# Patient Record
Sex: Male | Born: 1960 | Race: White | Hispanic: No | Marital: Married | Smoking: Former smoker
Health system: Southern US, Community
[De-identification: ages and names within clinical notes are randomized; demographics above are authoritative.]

## PROBLEM LIST (undated history)

## (undated) DIAGNOSIS — I10 Essential (primary) hypertension: Secondary | ICD-10-CM

## (undated) HISTORY — PX: HERNIA REPAIR: SHX51

## (undated) HISTORY — PX: BACK SURGERY: SHX140

---

## 2006-10-13 ENCOUNTER — Emergency Department: Payer: Self-pay | Admitting: Emergency Medicine

## 2006-10-28 ENCOUNTER — Emergency Department: Payer: Self-pay

## 2006-11-11 ENCOUNTER — Other Ambulatory Visit: Payer: Self-pay

## 2006-11-11 ENCOUNTER — Ambulatory Visit: Payer: Self-pay | Admitting: General Surgery

## 2007-03-29 ENCOUNTER — Ambulatory Visit: Payer: Self-pay | Admitting: Gastroenterology

## 2008-11-30 ENCOUNTER — Ambulatory Visit (HOSPITAL_COMMUNITY): Admission: RE | Admit: 2008-11-30 | Discharge: 2008-12-01 | Payer: Self-pay | Admitting: Specialist

## 2009-06-20 ENCOUNTER — Encounter (INDEPENDENT_AMBULATORY_CARE_PROVIDER_SITE_OTHER): Payer: Self-pay | Admitting: Specialist

## 2009-06-20 ENCOUNTER — Ambulatory Visit (HOSPITAL_COMMUNITY): Admission: RE | Admit: 2009-06-20 | Discharge: 2009-06-21 | Payer: Self-pay | Admitting: Specialist

## 2011-02-15 LAB — COMPREHENSIVE METABOLIC PANEL
Albumin: 3.9 g/dL (ref 3.5–5.2)
Alkaline Phosphatase: 48 U/L (ref 39–117)
BUN: 10 mg/dL (ref 6–23)
Chloride: 102 mEq/L (ref 96–112)
Creatinine, Ser: 1.03 mg/dL (ref 0.4–1.5)
Glucose, Bld: 86 mg/dL (ref 70–99)
Potassium: 3.9 mEq/L (ref 3.5–5.1)
Total Bilirubin: 1 mg/dL (ref 0.3–1.2)
Total Protein: 7.6 g/dL (ref 6.0–8.3)

## 2011-02-15 LAB — CBC
HCT: 46.7 % (ref 39.0–52.0)
Hemoglobin: 15.5 g/dL (ref 13.0–17.0)
MCV: 85.5 fL (ref 78.0–100.0)
Platelets: 210 10*3/uL (ref 150–400)
RDW: 14.4 % (ref 11.5–15.5)

## 2011-02-15 LAB — URINALYSIS, ROUTINE W REFLEX MICROSCOPIC
Bilirubin Urine: NEGATIVE
Hgb urine dipstick: NEGATIVE
Nitrite: NEGATIVE
Protein, ur: NEGATIVE mg/dL
Specific Gravity, Urine: 1.015 (ref 1.005–1.030)
Urobilinogen, UA: 0.2 mg/dL (ref 0.0–1.0)

## 2011-02-15 LAB — PROTIME-INR
INR: 1 (ref 0.00–1.49)
Prothrombin Time: 13 seconds (ref 11.6–15.2)

## 2011-02-15 LAB — APTT: aPTT: 29 seconds (ref 24–37)

## 2011-02-24 LAB — CBC
MCHC: 32.7 g/dL (ref 30.0–36.0)
MCV: 84.5 fL (ref 78.0–100.0)
Platelets: 209 10*3/uL (ref 150–400)
RDW: 14.7 % (ref 11.5–15.5)

## 2011-02-24 LAB — COMPREHENSIVE METABOLIC PANEL
ALT: 24 U/L (ref 0–53)
AST: 18 U/L (ref 0–37)
Albumin: 4.1 g/dL (ref 3.5–5.2)
Calcium: 9.4 mg/dL (ref 8.4–10.5)
Creatinine, Ser: 1.01 mg/dL (ref 0.4–1.5)
GFR calc Af Amer: >60 mL/min (ref >60)
GFR calc non Af Amer: >60 mL/min (ref >60)
Sodium: 143 mEq/L (ref 135–145)
Total Protein: 7.4 g/dL (ref 6.0–8.3)

## 2011-02-24 LAB — URINALYSIS, ROUTINE W REFLEX MICROSCOPIC
Ketones, ur: NEGATIVE mg/dL
Nitrite: NEGATIVE
Specific Gravity, Urine: 1.023 (ref 1.005–1.030)
pH: 6 (ref 5.0–8.0)

## 2011-02-24 LAB — APTT: aPTT: 37 seconds (ref 24–37)

## 2011-03-25 NOTE — Op Note (Signed)
NAMEJAYLUN, FLEENER               ACCOUNT NO.:  [id number]   MEDICAL RECORD NO.:  [id number]          PATIENT TYPE:  OIB   LOCATION:  0098                         FACILITY:  [facility]   PHYSICIAN:  Jene Every, M.D.    DATE OF BIRTH:  Dec 20, 1960   DATE OF PROCEDURE:  11/30/2008  DATE OF DISCHARGE:                               OPERATIVE REPORT   PREOPERATIVE DIAGNOSIS:  Recurrent herniated nucleus pulposus spinal  stenosis, L5-S1.   POSTOPERATIVE DIAGNOSIS:  Recurrent herniated nucleus pulposus spinal  stenosis, L5-S1.   PROCEDURES PERFORMED:  1. Bilateral lateral recess decompression redo at L5-S1.  2. Bilateral foraminotomies at S1.  3. Microdiskectomy, L5-S1, left.   ANESTHESIA:  General.   ASSISTANT:  Roma Schanz, P.A.   Technical difficulty increased due to the patient's morbid obesity and  previous surgery.   TECHNIQUE:  The patient in supine position.  After induction of adequate  anesthesia and 2 grams Kefzol, he was placed prone on the [place] frame.  All bony prominences well-padded.  Lumbar region prepped and draped in  the usual sterile fashion.  Two 18-gauge spinal needles utilized to  localize the 5-1 interspace.  This was confirmed with x-ray.  Previous  surgical incision was excised.  Subcutaneous tissue was dissected and  electrocautery was utilized for hemostasis.  Dorsolumbar fascia  identified and divided in line with the skin incision.  Paraspinous  muscle elevated from lamina of 5-1.  The McCullough retractor was placed  first on the left at the nonoperative space, although there was scar  tissue here, suggesting this was entered as well.  Confirmatory  radiograph obtained.  The curet was utilized to detach ligamentum flavum  from the cephalad edge of S1.  Hemilaminotomy with 2 mm Kerrison was  performed, detaching the ligamentum flavum.  Neural patty placed beneath  the ligamentum flavum.  A 2 mm was then utilized to decompress the  lateral  recess to medial border of the pedicle.  Hemilaminotomy of the  caudad edge of 5 was performed as well.  The patient did have some  lateral recess stenosis here, but no disk herniation.  The nerve was  mobile at least a centimeter medial to the pedicle, without difficulty  and a hockey stick probe was placed at the foramen of 5 and found to be  widely patent.  We entered the right side, as the patient preoperatively  was found and had indicated over the past 2 weeks he had had right-sided  symptoms, worse than his left and had positive neural tension signs  bilaterally.   Next, then we turned attention to the left.  The operating microscope  was draped and brought on the surgical field.  A fair amount of scar  tissue was encountered and debrided.  We skeletonized the previous  hemilaminotomy with a small curette.  We enlarged the hemilaminotomy at  S1 and under 5, detaching the remaining ligamentum flavum.  Severe  lateral recess stenosis noted and the S1 nerve was noted be compressed  into the lateral recess.  We decompressed the lateral recess to a medial  border  of the pedicle.  There was epidural fibrosis and a vascular  release tethering the S1 nerve root.  This was gently lysed.  We able to  mobilize the S1 nerve root at least a centimeter medial to the pedicle  without difficulty.  Beneath that was a focal HNP, that we confirmed the  space by x-ray, and it was a fairly hardened disk towards the center.  I  performed the annulotomy and small portions of disk material were  removed.  This was extensively evaluated, but again the predominance,  this was a hardened, calcified disk.  Again, centrally, following  decompression laterally and the lysis of adhesions, we had good mobility  of the S1 nerve root.  Performed a foraminotomy of 5 and hockey stick  probe passed freely up the foramen of 5 and S1.  Disk space and the  lateral recess were copiously irrigated with antibiotic  irrigation.  Inspection revealed no evidence of CSF leakage or active bleeding.  Epidural fat was draped over the nerve root.  McCullough retractor was  removed was removed.  We irrigated the paraspinous muscles.  No evidence  of active bleeding.  We repaired the dorsolumbar fascia with #1 Vicryl  interrupted figure-of-eight sutures.  Subcutaneous tissue reapproximated  with 2-0 Vicryl simple sutures.  Skin was reapproximated with staples.  The wound was dressed sterilely.  He was then placed on the hospital  bed, extubated without difficulty and transported to the recovery room  in satisfactory condition.   The patient tolerated the procedure well, no complications.  Assistant,  Roma Schanz, P.A.  Minimal blood loss.      Jene Every, M.D.  Electronically Signed     JB/MEDQ  D:  11/30/2008  T:  11/30/2008  Job:  [id number]

## 2011-03-25 NOTE — Op Note (Signed)
Bryan [name], Bryan [name]               ACCOUNT NO.:  [id number]   MEDICAL RECORD NO.:  [id number]          PATIENT TYPE:  AMB   LOCATION:  DAY                          FACILITY:  [facility]   PHYSICIAN:  Jene Every, M.D.    DATE OF BIRTH:  1961/09/21   DATE OF PROCEDURE:  DATE OF DISCHARGE:                               OPERATIVE REPORT   PREOPERATIVE DIAGNOSIS:  Recurrent disk herniation, spinal stenosis L3,  right.   POSTOPERATIVE DIAGNOSES:  Recurrent disk herniation, spinal stenosis L3,  right.   PROCEDURES PERFORMED:  1. Redo microdiskectomy L5-S1, L3-4 right.  2. Redo decompression L3-4, right.  3. Use of operating microscope.   BRIEF HISTORY:  47, L4 radiculopathy secondary recurrent disk herniation  L3-4.  Failed conserve treatment, indicated for decompression.  Risks,  benefits discussed, including bleeding, infection, __damage to  neurovascular structures________, CSF leakage, epidural fibrosis,  __adjacent________ disease, need for fusion in future, anesthetic  complications etc.   The patient in supine position.  After induction of adequate anesthesia,  2 g Kefzol, was placed prone on the [place] frame.  All bony prominences  well padded.  Lumbar region prepped and draped in the usual sterile  fashion.  Two 20-gauge spinal needles utilized to localize the 3-4  interspace.  I excised previous surgical scar.  Incision was made to the  spinous process 3 to 4, subcutaneous tissue was dissected.  Electrocautery used to achieve hemostasis.  Dorsolumbar fascia  identified, divided in line with the skin incision.  Paraspinous muscle  elevated from lamina of 3 and 4.  Operating microscope draped and  brought into the surgical field.  Penfield 4 in the interlaminar space.  Detected the 2-3 space first.  We redirected the McCullough retractor to  the 3-4 space seeing scar tissue, confirming the level with a repeat  radiograph.  I used a straight curette to detach the fibrosis from  the  caudad edge of 3.  We had an ample pars here.  We entered the medial  aspect of the inferior facet with a straight curette and 2-mm Kerrison  performing an enlarged hemilaminotomy, preserving the pars up to the  attachment of the ligamentum flavum that was removed, epidural fat was  noted here.  Distally we performed a foraminotomy of L4, identified the  L4 root, gently mobilized it medially, meticulously mobilized the 4 root  from extensive epidural fibrosis.  We had a retractor gently mobilizing  it medially.  We identified the disk space.  With a penfield 4__________  mobilized it and identified the lateral aspect of thecal sac.  We then  identified the disk herniation and mobilized three large fragments from  beneath thecal sac without difficulty then entered the disk space with a  micropituitary completing a full diskectomy of herniated material.  Following this and a foraminotomy of L3-L4 there was ample mobilization  of 4 root and of the 3 root.  No CSF leakage or active bleeding.  We  copiously irrigated disk space with antibiotic irrigation.  Bipolar  cautery to achieve hemostasis.  Hockey stick probe passed freely in the  foramen of 3 and of 4.  There was an ample and wide pars.  No fracture.   Next, McCullough retractors removed, irrigated.  No paraspinous bleeding  noted.  Dorsolumbar fascia reapproximated #1 Vicryl figure-of-eight  sutures.  Subcutaneous tissue reapproximated with 2-0 Vicryl closure.  Skin was reapproximated 3-0 subcuticular Prolene.  Wound reinforced with  Steri-Strips.  Sterile dressing applied.  Placed supine on hospital bed,  extubated without difficulty and transported to recovery in satisfactory  condition.   The patient tolerated the procedure.  There were no complications.Bryan [name] BLOOD LOSS:  50 mL      Jene Every, M.D.  Electronically Signed     JB/MEDQ  D:  06/20/2009  T:  06/20/2009  Job:  [id number]

## 2011-03-28 NOTE — Op Note (Signed)
[name], [name] NO.:  [id number]   MEDICAL RECORD NO.:  [id number]           PATIENT TYPE:   LOCATION:                                 FACILITY:   PHYSICIAN:  Jene Every, M.D.         DATE OF BIRTH:   DATE OF PROCEDURE:  DATE OF DISCHARGE:                               OPERATIVE REPORT   ADDENDUM   ASSISTANT:  Roma Schanz, PA      Jene Every, M.D.  Electronically Signed     JB/MEDQ  D:  06/22/2009  T:  06/23/2009  Job:  [id number]

## 2013-07-08 ENCOUNTER — Ambulatory Visit: Payer: Self-pay | Admitting: Gastroenterology

## 2013-08-15 ENCOUNTER — Ambulatory Visit: Payer: Self-pay | Admitting: Specialist

## 2013-08-15 LAB — POTASSIUM: Potassium: 3.7 mmol/L (ref 3.5–5.1)

## 2013-08-19 ENCOUNTER — Ambulatory Visit: Payer: Self-pay | Admitting: Specialist

## 2015-03-02 NOTE — Op Note (Signed)
PATIENT NAME:  Bryan [name], Bryan [name] MR#:  [id number] DATE OF BIRTH:  16-Jan-1961  DATE OF PROCEDURE:  08/19/2013  PREOPERATIVE DIAGNOSIS: Severe posterior tibial tendinitis, left ankle with prominent medial bone fragment.   POSTOPERATIVE DIAGNOSES: 1.  Rupture posterior tibial tendon, left ankle.  2.  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  3.  Prominent medial malleolus bone fragment.   PROCEDURE: 1.  Complete synovectomy posterior tibial tendon, left ankle.  2.  Repair of ruptured posterior tibial tendon with transfer of flexor digitorum tendon to augment and complete the repair.   SURGEON: Myra Rudehristopher Lillyian Heidt, M.D.   ANESTHESIA: General.   COMPLICATIONS: None.   Tourniquet time: 75 minutes.   DESCRIPTION OF PROCEDURE: Ancef 2 grams were given intravenously prior to the procedure. General anesthesia is induced. The left lower extremity is thoroughly prepped with alcohol and ChloraPrep and draped in standard sterile fashion. The leg is elevated and the extremity is wrapped out with the Esmarch bandage. Pneumatic tourniquet is elevated to 350 mmHg. Smile-shaped posterior incision is made over the line of the posterior tibial tendon. The dissection is carefully carried down to the tendon sheath. Hemostasis is maintained at all times with the Bovie. The posterior tibial tendon sheath is carefully incised and there is seen to be [name] significant amount of hypertrophic synovium present and [name] moderate amount of brown-yellow material, which is possibly consistent with pigmented villo-nodular synovitis. Complete synovectomy is performed along with removal of the other material. There is seen to be essentially [name] complete rupture of the posterior tibial tendon directly behind the medial malleolus and this is associated with [name] prominent ridge of bone, which is the fragment of bone seen on the x-ray. The rongeur is used to remove the fragment and the area of the medial malleolus  is smoothed off and the open cancellous surfaces is covered with bone wax. The frayed and useless portions of the posterior tibial tendon are removed sharply with the knife and the scissors. The tendon is repaired incompletely with multiple 4-0 Mersilene sutures. The tendon sheath posterior to this was then opened and the flexor digitorum of the toes is brought up adjacent to the posterior tibial tendon and sutured in continuity with using multiple horizontal mattress sutures of all 4-0 Mersilene. The wound is thoroughly irrigated multiple times. The posterior tibial tendon sheath is then loosely closed with 4-0 nylon. Subcutaneous tissue is closed with 5-0 Vicryl and the skin is closed with interrupted 4-0 nylon sutures. The wound is thoroughly irrigated multiple times again. Soft bulky dressing is applied along with [name] sugar tong type posterior splint. The tourniquet is released. The patient is returned to the recovery room in satisfactory condition having tolerated the procedure quite well.    ____________________________ Clare Gandyhristopher E. Kairy Folsom, MD ces:aw D: 08/22/2013 08:24:08 ET T: 08/22/2013 09:10:16 ET JOB#: [id number]  cc: Clare Gandyhristopher E. Kendi Defalco, MD, <Dictator> Clare GandyHRISTOPHER E Jemuel Laursen MD ELECTRONICALLY SIGNED 08/24/2013 6:31

## 2015-03-25 ENCOUNTER — Ambulatory Visit
Admission: EM | Admit: 2015-03-25 | Discharge: 2015-03-25 | Disposition: A | Discharge status: Home / Self Care | Payer: PRIVATE HEALTH INSURANCE | Attending: Family Medicine | Admitting: Family Medicine

## 2015-03-25 ENCOUNTER — Encounter: Payer: Self-pay | Admitting: *Deleted

## 2015-03-25 DIAGNOSIS — J0100 Acute maxillary sinusitis, unspecified: Secondary | ICD-10-CM | POA: Diagnosis not present

## 2015-03-25 HISTORY — DX: Essential (primary) hypertension: I10

## 2015-03-25 MED ORDER — IPRATROPIUM BROMIDE 0.06 % NA SOLN: 2.0000 | NASAL | Status: DC | PRN

## 2015-03-25 MED ORDER — PREDNISONE 10 MG PO TABS: ORAL_TABLET | ORAL | Status: DC

## 2015-03-25 MED ORDER — AMOXICILLIN-POT CLAVULANATE 875-125 MG PO TABS: 1.0000 | ORAL_TABLET | Freq: Two times a day (BID) | ORAL | Status: DC

## 2015-03-25 MED ORDER — HYDROCOD POLST-CPM POLST ER 10-8 MG/5ML PO SUER: 5.0000 mL | Freq: Two times a day (BID) | ORAL | Status: DC | PRN

## 2015-03-25 NOTE — ED Notes (Signed)
Sinus pressure, headache, cough and general malaise since Wednesday.  Fever and chills.  Lungs clear.

## 2015-03-25 NOTE — ED Provider Notes (Signed)
CSN: [id number]     Arrival date & time 03/25/15  1133 History   First MD Initiated Contact with Patient 03/25/15 1323     Chief Complaint  Patient presents with  . Facial Pain   (Consider location/radiation/quality/duration/timing/severity/associated sxs/prior Treatment) HPI     54 year old male presents for evaluation of what he assumes a sinus infection. Starting 5 days ago he has sinus pain and pressure, sore throat, cough, nasal burning pain, subjective fever and chills, body aches. Symptoms have gradually been worsening up until today. Over-the-counter medications are not helping. No recent travel or sick contacts.  Past Medical History  Diagnosis Date  . Hypertension    Past Surgical History  Procedure Laterality Date  . Hernia repair    . Back surgery    . Back surgery  ankle acl right   Family History  Problem Relation Age of Onset  . Heart failure Father   . Cancer Sister    History  Substance Use Topics  . Smoking status: Former Smoker -- 0.50 packs/day    Types: Cigarettes    Quit date: 03/25/1979  . Smokeless tobacco: Not on file  . Alcohol Use: Yes    Review of Systems  Constitutional: Positive for fever and chills.  HENT: Positive for congestion, postnasal drip, rhinorrhea, sinus pressure and sore throat. Negative for ear pain.   Respiratory: Positive for cough. Negative for shortness of breath.   Cardiovascular: Negative for chest pain.  Neurological: Positive for headaches.  All other systems reviewed and are negative.   Allergies  Review of patient's allergies indicates no known allergies.  Home Medications   Prior to Admission medications   Medication Sig Start Date End Date Taking? Authorizing Provider  furosemide (LASIX) 20 MG tablet Take 20 mg by mouth.   Yes Historical Provider, MD  HYDROcodone-acetaminophen (NORCO/VICODIN) 5-325 MG per tablet Take 1 tablet by mouth every 6 (six) hours as needed for moderate pain.   Yes Historical Provider, MD   temazepam (RESTORIL) 30 MG capsule Take 30 mg by mouth at bedtime as needed for sleep.   Yes Historical Provider, MD  amoxicillin-clavulanate (AUGMENTIN) 875-125 MG per tablet Take 1 tablet by mouth every 12 (twelve) hours. 03/25/15   Graylon GoodZachary H Yashua Bracco, PA-C  chlorpheniramine-HYDROcodone (TUSSIONEX PENNKINETIC ER) 10-8 MG/5ML SUER Take 5 mLs by mouth every 12 (twelve) hours as needed for cough. 03/25/15   Adrian BlackwaterZachary H Reve Crocket, PA-C  ipratropium (ATROVENT) 0.06 % nasal spray Place 2 sprays into both nostrils every 4 (four) hours as needed for rhinitis. 03/25/15   Graylon GoodZachary H Aleisa Howk, PA-C  predniSONE (DELTASONE) 10 MG tablet 4 tabs PO QD for 4 days; 3 tabs PO QD for 3 days; 2 tabs PO QD for 2 days; 1 tab PO QD for 1 day 03/25/15   Graylon GoodZachary H Jumaane Weatherford, PA-C   BP 124/94 mmHg  Pulse 69  Temp(Src) 98.6 F (37 C) (Oral)  Resp 20  Ht 6' (1.829 m)  Wt 265 lb (120.203 kg)  BMI 35.93 kg/m2  SpO2 96% Physical Exam  Constitutional: He is oriented to person, place, and time. He appears well-developed and well-nourished. He appears distressed (uncomfortable appearing ).  HENT:  Head: Normocephalic and atraumatic.  Right Ear: External ear normal.  Left Ear: External ear normal.  Nose: Right sinus exhibits maxillary sinus tenderness. Right sinus exhibits no frontal sinus tenderness. Left sinus exhibits maxillary sinus tenderness. Left sinus exhibits no frontal sinus tenderness.  Mouth/Throat: Uvula is midline, oropharynx is clear and moist and  mucous membranes are normal. No oropharyngeal exudate or posterior oropharyngeal erythema.  Eyes: Conjunctivae are normal.  Neck: Normal range of motion. Neck supple.  Cardiovascular: Normal rate, regular rhythm and normal heart sounds.   Pulmonary/Chest: Effort normal and breath sounds normal. No respiratory distress. He exhibits no tenderness.  Lymphadenopathy:    He has no cervical adenopathy.  Neurological: He is alert and oriented to person, place, and time. Coordination  normal.  Skin: Skin is warm and dry. No rash noted. He is not diaphoretic.  Psychiatric: He has a normal mood and affect. Judgment normal.  Nursing note and vitals reviewed.   ED Course  Procedures (including critical care time) Labs Review Labs Reviewed - No data to display  Imaging Review No results found.   MDM   1. Acute maxillary sinusitis, recurrence not specified    Acute sinusitis, treat with Augmentin. Also treating symptomatically. Follow-up if no improvement in 3-4 days   Discharge Medication List as of 03/25/2015  1:31 PM    START taking these medications   Details  amoxicillin-clavulanate (AUGMENTIN) 875-125 MG per tablet Take 1 tablet by mouth every 12 (twelve) hours., Starting 03/25/2015, Until Discontinued, Normal    chlorpheniramine-HYDROcodone (TUSSIONEX PENNKINETIC ER) 10-8 MG/5ML SUER Take 5 mLs by mouth every 12 (twelve) hours as needed for cough., Starting 03/25/2015, Until Discontinued, Print    ipratropium (ATROVENT) 0.06 % nasal spray Place 2 sprays into both nostrils every 4 (four) hours as needed for rhinitis., Starting 03/25/2015, Until Discontinued, Normal    predniSONE (DELTASONE) 10 MG tablet 4 tabs PO QD for 4 days; 3 tabs PO QD for 3 days; 2 tabs PO QD for 2 days; 1 tab PO QD for 1 day, Normal           Graylon GoodZachary H Johneisha Broaden, PA-C 03/25/15 1348

## 2015-03-25 NOTE — Discharge Instructions (Signed)

## 2019-12-16 ENCOUNTER — Other Ambulatory Visit: Payer: Self-pay | Admitting: Internal Medicine

## 2019-12-16 DIAGNOSIS — G9349 Other encephalopathy: Secondary | ICD-10-CM

## 2019-12-28 ENCOUNTER — Other Ambulatory Visit: Payer: Self-pay

## 2019-12-28 ENCOUNTER — Ambulatory Visit
Admission: RE | Admit: 2019-12-28 | Discharge: 2019-12-28 | Disposition: A | Discharge status: Home / Self Care | Payer: PRIVATE HEALTH INSURANCE | Source: Ambulatory Visit | Attending: Internal Medicine | Admitting: Internal Medicine

## 2019-12-28 DIAGNOSIS — G9349 Other encephalopathy: Secondary | ICD-10-CM | POA: Diagnosis not present

## 2021-10-18 IMAGING — MR MR HEAD W/O CM
12 series · 45 of 48 positions shown · non-contrast
Comparison: No pertinent prior studies available for comparison.

CLINICAL DATA: Encephalopathy chronic. Additional history provided
by scanning technologist: Patient reports loss of words when
speaking, losing track of tasks, daily, symptoms for 6 months.

EXAM:
MRI HEAD WITHOUT CONTRAST
TECHNIQUE: Multiplanar, multiecho pulse sequences of the brain and surrounding
structures were obtained without intravenous contrast.

[Series 5: ax dwi_tracew · axial · 3.0mm · 0.60mm/px · z∈[-101,+54]mm · 3 of 48 slices shown]
[im 1/48]
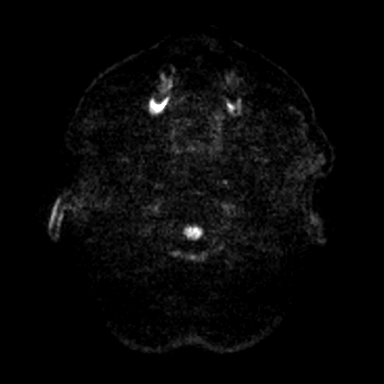
[im 24/48]
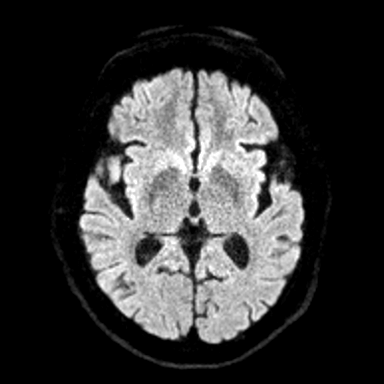
[im 48/48]
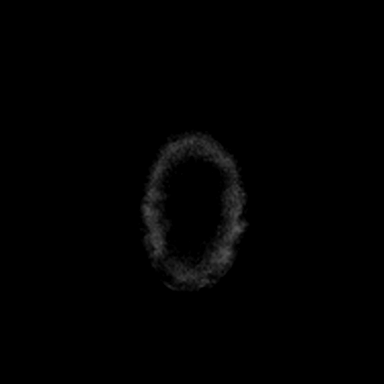

[Series 6: ax dwi_adc · axial · 3.0mm · 0.60mm/px · z∈[-101,+54]mm · 3 of 48 slices shown]
[im 1/48]
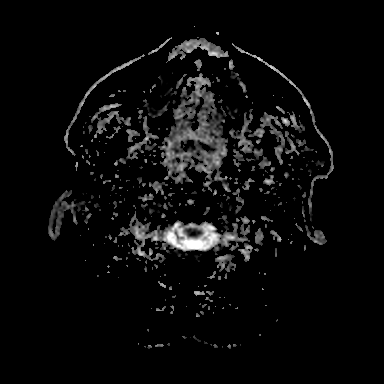
[im 24/48]
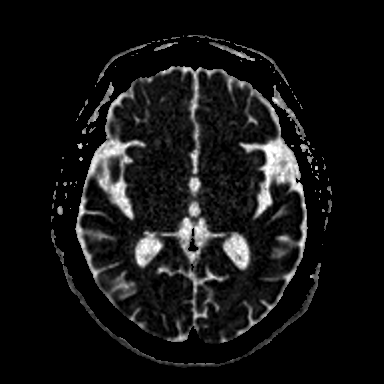
[im 48/48]
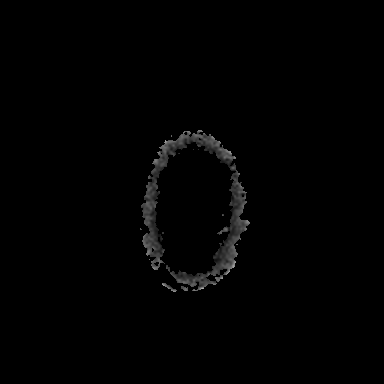

[Series 7: cor dwi_tracew · coronal · 5.0mm · 0.60mm/px · 3 of 36 slices shown]
[im 1/36]
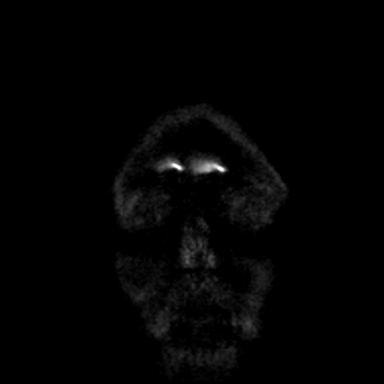
[im 18/36]
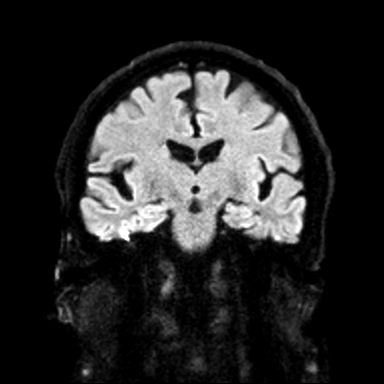
[im 36/36]
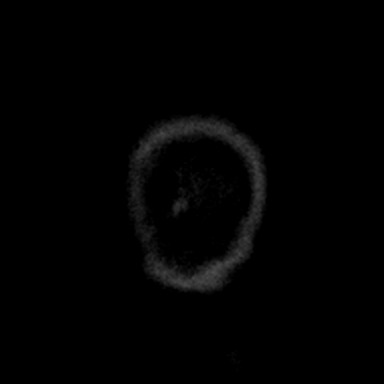

[Series 8: cor dwi_adc · coronal · 5.0mm · 0.60mm/px · 3 of 36 slices shown]
[im 1/36]
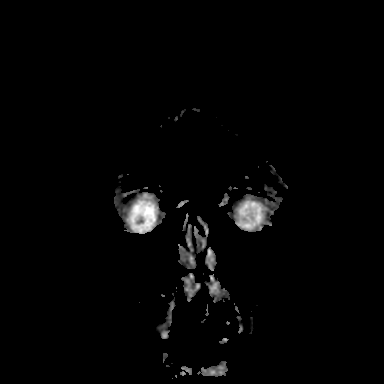
[im 18/36]
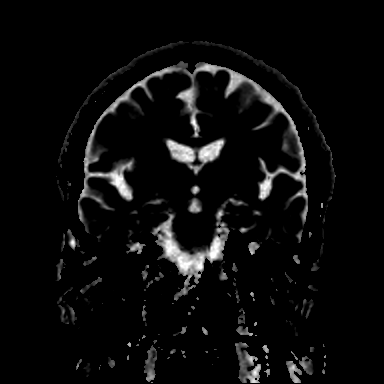
[im 36/36]
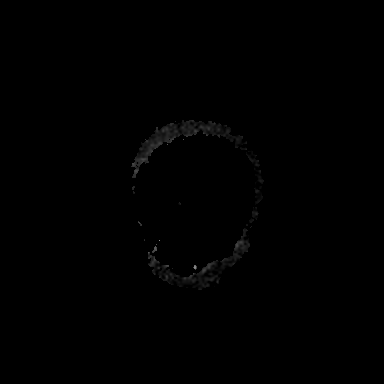

[Series 9: T1 · sagittal · 5.0mm · 0.62mm/px · 2 of 23 slices shown (1 of 2)]
[im 1/23]
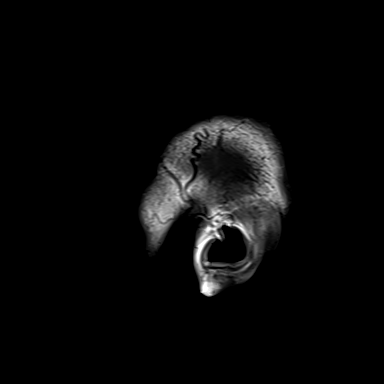
[im 23/23]
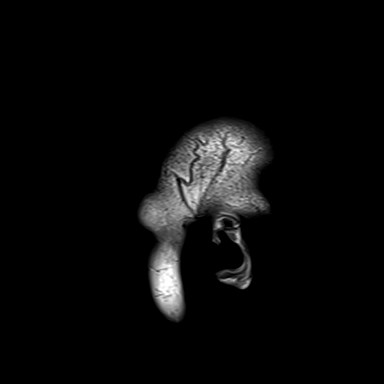

[Series 10: T2 · axial · 5.0mm · 0.53mm/px · z∈[-95,+49]mm · 2 of 25 slices shown (1 of 2)]
[im 1/25]
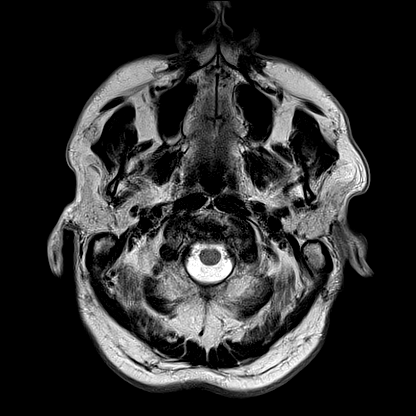
[im 25/25]
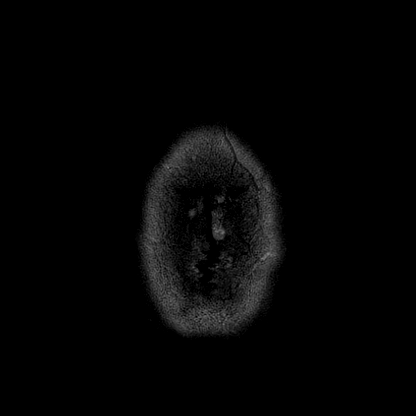

[Series 11: mag_images · axial · 3.0mm · 0.90mm/px · z∈[-111,+66]mm · 5 of 60 slices shown]
[im 1/60]
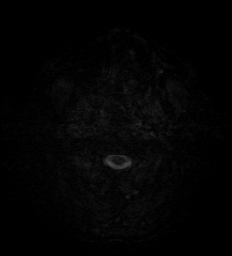
[im 15/60]
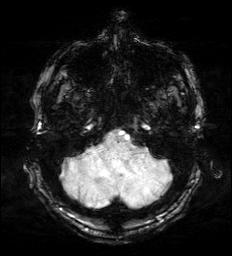
[im 30/60]
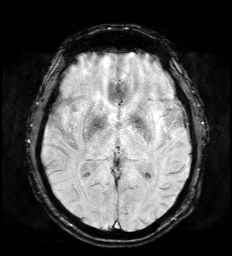
[im 45/60]
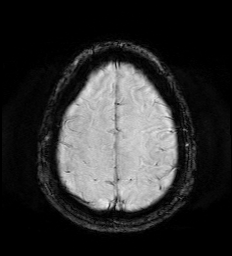
[im 60/60]
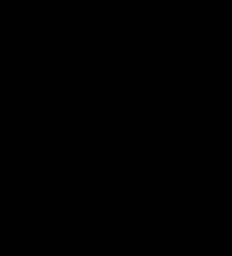

[Series 12: pha_images · axial · 3.0mm · 0.90mm/px · z∈[-111,+63]mm · 4 of 57 slices shown]
[im 1/57]
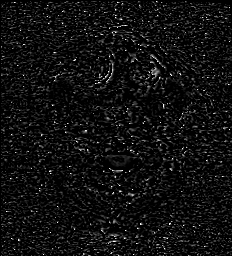
[im 19/57]
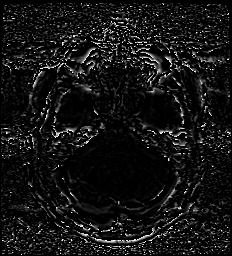
[im 38/57]
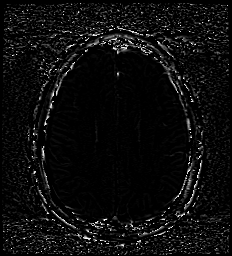
[im 57/57]
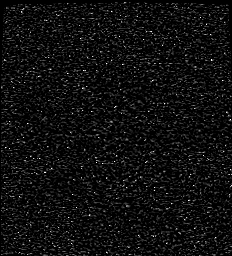

[Series 13: swi_images · axial · 3.0mm · 0.90mm/px · z∈[-111,+66]mm · 5 of 60 slices shown]
[im 1/60]
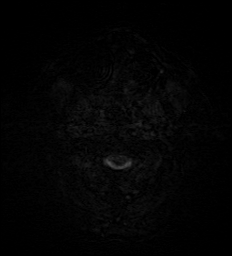
[im 15/60]
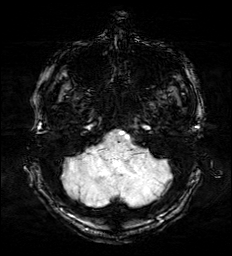
[im 30/60]
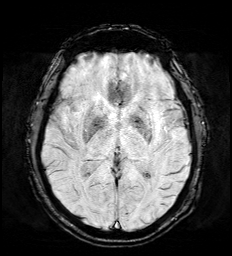
[im 45/60]
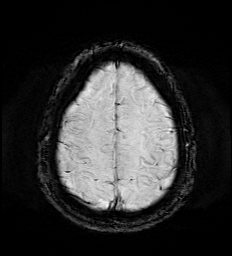
[im 60/60]
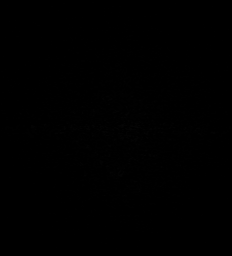

[Series 15: FLAIR · axial · 3.0mm · 0.53mm/px · z∈[-104,+58]mm · 4 of 55 slices shown]
[im 1/55]
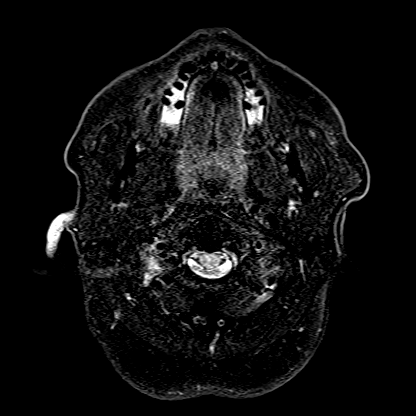
[im 19/55]
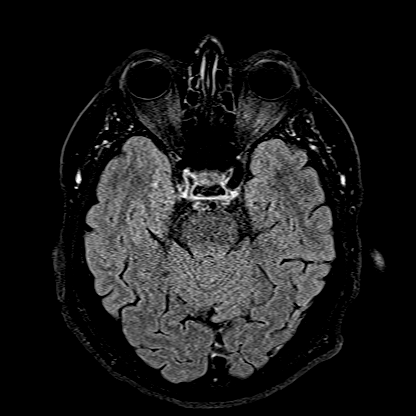
[im 37/55]
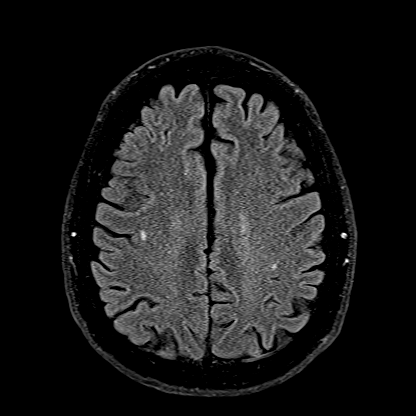
[im 55/55]
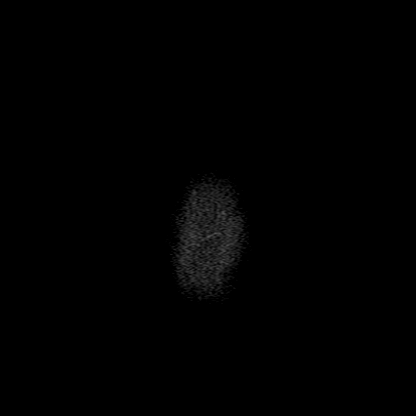

[Series 16: T1 · axial · 1.0mm · 0.98mm/px · z∈[-103,+56]mm · 9 of 160 slices shown (2 of 2)]
[im 1/160]
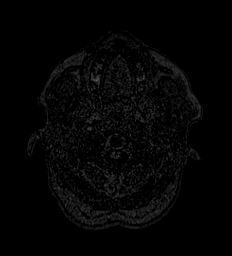
[im 15/160]
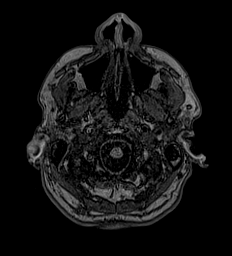
[im 29/160]
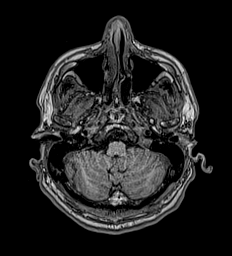
[im 44/160]
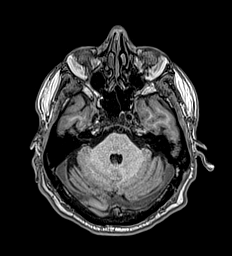
[im 73/160]
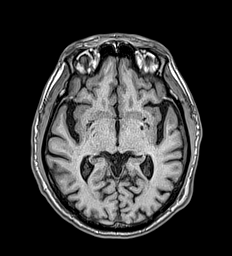
[im 87/160]
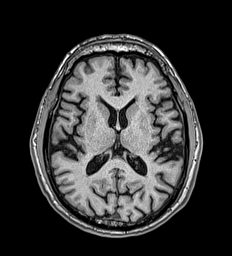
[im 116/160]
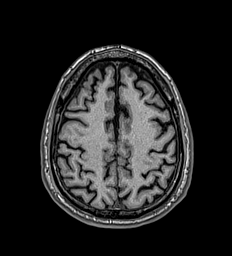
[im 131/160]
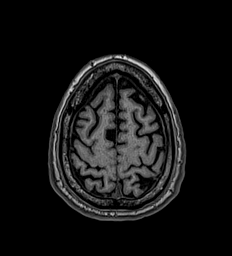
[im 160/160]
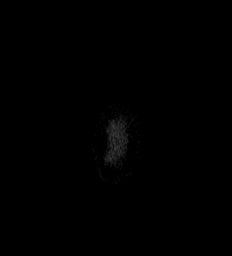

[Series 17: T2 · coronal · 5.0mm · 0.57mm/px · 2 of 29 slices shown (2 of 2)]
[im 1/29]
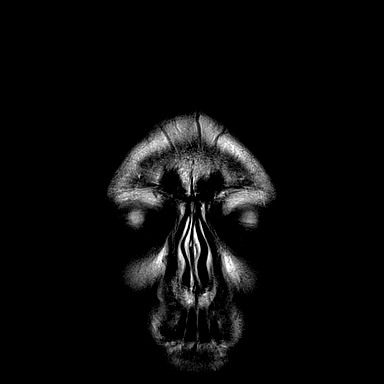
[im 29/29]
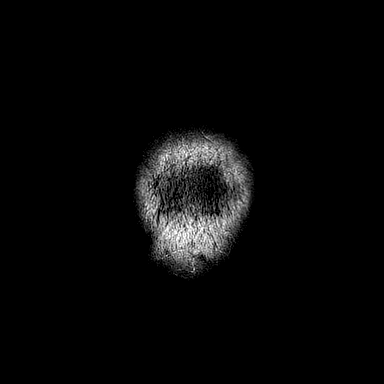

[45 of 48 positions shown; findings below may reference images not displayed]

FINDINGS: Brain:

Intermittently motion degraded. Most notably, there is mild/moderate
motion degradation of the axial T2 FLAIR sequence.

There is no evidence of acute infarct.

No evidence of intracranial mass.

No midline shift or extra-axial fluid collection.

No chronic intracranial blood products.

Mild scattered T2/FLAIR hyperintensity within the cerebral white
matter is nonspecific, but consistent with chronic small vessel
ischemic disease.

Mild generalized parenchymal atrophy.

Vascular: Flow voids maintained within the proximal large arterial
vessels.

Skull and upper cervical spine: No focal marrow lesion.

Sinuses/Orbits: Visualized orbits demonstrate no acute abnormality.
Minimal ethmoid sinus mucosal thickening. Trace fluid within right
mastoid air cells.
IMPRESSION: Intermittently motion degraded exam.

No evidence of acute intracranial abnormality.

Mild generalized parenchymal atrophy and chronic small vessel
ischemic disease.

## 2023-12-09 ENCOUNTER — Inpatient Hospital Stay
Admission: EM | Admit: 2023-12-09 | Discharge: 2023-12-11 | DRG: 100 | Disposition: A | Discharge status: Home Health | Payer: No Typology Code available for payment source | Attending: Student | Admitting: Student

## 2023-12-09 ENCOUNTER — Emergency Department: Payer: No Typology Code available for payment source

## 2023-12-09 ENCOUNTER — Other Ambulatory Visit: Payer: Self-pay

## 2023-12-09 ENCOUNTER — Ambulatory Visit: Payer: No Typology Code available for payment source

## 2023-12-09 ENCOUNTER — Inpatient Hospital Stay
Admit: 2023-12-09 | Discharge: 2023-12-09 | Disposition: A | Discharge status: Home / Self Care | Payer: No Typology Code available for payment source | Attending: Student | Admitting: Student

## 2023-12-09 DIAGNOSIS — Z8249 Family history of ischemic heart disease and other diseases of the circulatory system: Secondary | ICD-10-CM

## 2023-12-09 DIAGNOSIS — F03918 Unspecified dementia, unspecified severity, with other behavioral disturbance: Secondary | ICD-10-CM | POA: Diagnosis present

## 2023-12-09 DIAGNOSIS — I493 Ventricular premature depolarization: Secondary | ICD-10-CM | POA: Diagnosis present

## 2023-12-09 DIAGNOSIS — Z79899 Other long term (current) drug therapy: Secondary | ICD-10-CM

## 2023-12-09 DIAGNOSIS — Z6837 Body mass index (BMI) 37.0-37.9, adult: Secondary | ICD-10-CM

## 2023-12-09 DIAGNOSIS — Z87891 Personal history of nicotine dependence: Secondary | ICD-10-CM

## 2023-12-09 DIAGNOSIS — Z66 Do not resuscitate: Secondary | ICD-10-CM | POA: Diagnosis present

## 2023-12-09 DIAGNOSIS — R680 Hypothermia, not associated with low environmental temperature: Secondary | ICD-10-CM | POA: Diagnosis present

## 2023-12-09 DIAGNOSIS — R9431 Abnormal electrocardiogram [ECG] [EKG]: Secondary | ICD-10-CM | POA: Diagnosis not present

## 2023-12-09 DIAGNOSIS — F05 Delirium due to known physiological condition: Secondary | ICD-10-CM | POA: Diagnosis present

## 2023-12-09 DIAGNOSIS — M6282 Rhabdomyolysis: Secondary | ICD-10-CM | POA: Diagnosis present

## 2023-12-09 DIAGNOSIS — R569 Unspecified convulsions: Secondary | ICD-10-CM | POA: Diagnosis present

## 2023-12-09 DIAGNOSIS — T68XXXA Hypothermia, initial encounter: Secondary | ICD-10-CM

## 2023-12-09 DIAGNOSIS — Z791 Long term (current) use of non-steroidal anti-inflammatories (NSAID): Secondary | ICD-10-CM

## 2023-12-09 DIAGNOSIS — I214 Non-ST elevation (NSTEMI) myocardial infarction: Secondary | ICD-10-CM | POA: Diagnosis present

## 2023-12-09 DIAGNOSIS — R251 Tremor, unspecified: Secondary | ICD-10-CM | POA: Diagnosis present

## 2023-12-09 DIAGNOSIS — I10 Essential (primary) hypertension: Secondary | ICD-10-CM | POA: Diagnosis present

## 2023-12-09 DIAGNOSIS — F03911 Unspecified dementia, unspecified severity, with agitation: Secondary | ICD-10-CM | POA: Diagnosis present

## 2023-12-09 DIAGNOSIS — Z8616 Personal history of COVID-19: Secondary | ICD-10-CM

## 2023-12-09 DIAGNOSIS — E876 Hypokalemia: Secondary | ICD-10-CM | POA: Diagnosis present

## 2023-12-09 DIAGNOSIS — Z809 Family history of malignant neoplasm, unspecified: Secondary | ICD-10-CM | POA: Diagnosis not present

## 2023-12-09 DIAGNOSIS — Z1152 Encounter for screening for COVID-19: Secondary | ICD-10-CM

## 2023-12-09 DIAGNOSIS — Z7982 Long term (current) use of aspirin: Secondary | ICD-10-CM | POA: Diagnosis not present

## 2023-12-09 LAB — URINALYSIS, ROUTINE W REFLEX MICROSCOPIC
Bacteria, UA: NONE SEEN
Bilirubin Urine: NEGATIVE
Glucose, UA: NEGATIVE mg/dL
Ketones, ur: NEGATIVE mg/dL
Leukocytes,Ua: NEGATIVE
Nitrite: NEGATIVE
Protein, ur: 100 mg/dL — AB
Specific Gravity, Urine: 1.009 (ref 1.005–1.030)
pH: 5 (ref 5.0–8.0)

## 2023-12-09 LAB — MAGNESIUM: Magnesium: 2.1 mg/dL (ref 1.7–2.4)

## 2023-12-09 LAB — RESP PANEL BY RT-PCR (RSV, FLU A&B, COVID)  RVPGX2
Influenza A by PCR: NEGATIVE
Influenza B by PCR: NEGATIVE
Resp Syncytial Virus by PCR: NEGATIVE
SARS Coronavirus 2 by RT PCR: NEGATIVE

## 2023-12-09 LAB — CBC
HCT: 45.3 % (ref 39.0–52.0)
Hemoglobin: 15.7 g/dL (ref 13.0–17.0)
MCH: 28.5 pg (ref 26.0–34.0)
MCHC: 34.7 g/dL (ref 30.0–36.0)
MCV: 82.2 fL (ref 80.0–100.0)
Platelets: 165 10*3/uL (ref 150–400)
RBC: 5.51 MIL/uL (ref 4.22–5.81)
RDW: 14 % (ref 11.5–15.5)
WBC: 9.5 10*3/uL (ref 4.0–10.5)
nRBC: 0 % (ref 0.0–0.2)

## 2023-12-09 LAB — HEPARIN LEVEL (UNFRACTIONATED)
Heparin Unfractionated: 0.39 [IU]/mL (ref 0.30–0.70)
Heparin Unfractionated: 0.34 [IU]/mL (ref 0.30–0.70)

## 2023-12-09 LAB — COMPREHENSIVE METABOLIC PANEL
ALT: 21 U/L (ref 0–44)
AST: 29 U/L (ref 15–41)
Albumin: 3.7 g/dL (ref 3.5–5.0)
Alkaline Phosphatase: 55 U/L (ref 38–126)
Anion gap: 15 (ref 5–15)
BUN: 11 mg/dL (ref 8–23)
CO2: 24 mmol/L (ref 22–32)
Calcium: 8.6 mg/dL — ABNORMAL LOW (ref 8.9–10.3)
Chloride: 101 mmol/L (ref 98–111)
Creatinine, Ser: 0.97 mg/dL (ref 0.61–1.24)
GFR, Estimated: >60 mL/min (ref >60)
Glucose, Bld: 146 mg/dL — ABNORMAL HIGH (ref 70–99)
Potassium: 3.3 mmol/L — ABNORMAL LOW (ref 3.5–5.1)
Sodium: 140 mmol/L (ref 135–145)
Total Bilirubin: 0.7 mg/dL (ref 0.0–1.2)
Total Protein: 6.9 g/dL (ref 6.5–8.1)

## 2023-12-09 LAB — ACETAMINOPHEN LEVEL: Acetaminophen (Tylenol), Serum: <10 ug/mL — ABNORMAL LOW (ref 10–30)

## 2023-12-09 LAB — TROPONIN I (HIGH SENSITIVITY)
Troponin I (High Sensitivity): 213 ng/L (ref <18)
Troponin I (High Sensitivity): 638 ng/L (ref <18)
Troponin I (High Sensitivity): 951 ng/L (ref <18)
Troponin I (High Sensitivity): 871 ng/L
Troponin I (High Sensitivity): 874 ng/L (ref <18)

## 2023-12-09 LAB — SALICYLATE LEVEL: Salicylate Lvl: <7 mg/dL — ABNORMAL LOW (ref 7.0–30.0)

## 2023-12-09 LAB — URINE DRUG SCREEN, QUALITATIVE (ARMC ONLY)
Amphetamines, Ur Screen: NOT DETECTED
Barbiturates, Ur Screen: NOT DETECTED
Benzodiazepine, Ur Scrn: POSITIVE — AB
Cannabinoid 50 Ng, Ur ~~LOC~~: NOT DETECTED
Cocaine Metabolite,Ur ~~LOC~~: NOT DETECTED
MDMA (Ecstasy)Ur Screen: NOT DETECTED
Methadone Scn, Ur: NOT DETECTED
Opiate, Ur Screen: NOT DETECTED
Phencyclidine (PCP) Ur S: NOT DETECTED
Tricyclic, Ur Screen: NOT DETECTED

## 2023-12-09 LAB — ETHANOL: Alcohol, Ethyl (B): <10 mg/dL (ref <10)

## 2023-12-09 LAB — CK: Total CK: 402 U/L — ABNORMAL HIGH (ref 49–397)

## 2023-12-09 LAB — HIV ANTIBODY (ROUTINE TESTING W REFLEX): HIV Screen 4th Generation wRfx: NONREACTIVE

## 2023-12-09 MED ORDER — VALPROATE SODIUM 100 MG/ML IV SOLN
500.0000 mg | Freq: Three times a day (TID) | INTRAVENOUS | Status: DC
  Administered 2023-12-09 – 2023-12-11 (×5): 500 mg via INTRAVENOUS
  Filled 2023-12-09 (×9): qty 5

## 2023-12-09 MED ORDER — LORAZEPAM 2 MG/ML IJ SOLN
2.0000 mg | Freq: Once | INTRAMUSCULAR | Status: AC
Start: 2023-12-09 — End: 2023-12-09

## 2023-12-09 MED ORDER — NITROGLYCERIN 0.4 MG SL SUBL: 0.4000 mg | SUBLINGUAL_TABLET | SUBLINGUAL | Status: DC | PRN

## 2023-12-09 MED ORDER — SODIUM CHLORIDE 0.9 % IV SOLN
75.0000 mL/h | INTRAVENOUS | Status: AC
  Administered 2023-12-09: 75 mL/h via INTRAVENOUS

## 2023-12-09 MED ORDER — HALOPERIDOL LACTATE 5 MG/ML IJ SOLN: 1.0000 mg | INTRAMUSCULAR | Status: DC | PRN

## 2023-12-09 MED ORDER — HEPARIN BOLUS VIA INFUSION
4000.0000 [IU] | Freq: Once | INTRAVENOUS | Status: AC
  Administered 2023-12-09: 4000 [IU] via INTRAVENOUS
  Filled 2023-12-09: qty 4000

## 2023-12-09 MED ORDER — ASPIRIN 81 MG PO TBEC
81.0000 mg | DELAYED_RELEASE_TABLET | Freq: Every day | ORAL | Status: DC
Start: 2023-12-10 — End: 2023-12-12

## 2023-12-09 MED ORDER — SODIUM CHLORIDE 0.9 % IV BOLUS
1000.0000 mL | Freq: Once | INTRAVENOUS | Status: AC
  Administered 2023-12-09: 1000 mL via INTRAVENOUS

## 2023-12-09 MED ORDER — HALOPERIDOL LACTATE 5 MG/ML IJ SOLN
1.0000 mg | Freq: Four times a day (QID) | INTRAMUSCULAR | Status: DC | PRN
  Administered 2023-12-09: 2 mg via INTRAMUSCULAR
  Filled 2023-12-09: qty 1

## 2023-12-09 MED ORDER — HALOPERIDOL LACTATE 5 MG/ML IJ SOLN
INTRAMUSCULAR | Status: AC
  Filled 2023-12-09: qty 1

## 2023-12-09 MED ORDER — ONDANSETRON HCL 4 MG/2ML IJ SOLN: 4.0000 mg | Freq: Four times a day (QID) | INTRAMUSCULAR | Status: DC | PRN

## 2023-12-09 MED ORDER — LORAZEPAM 2 MG/ML IJ SOLN
0.5000 mg | Freq: Once | INTRAMUSCULAR | Status: AC
  Administered 2023-12-09: 0.5 mg via INTRAVENOUS
  Filled 2023-12-09: qty 1

## 2023-12-09 MED ORDER — VALPROATE SODIUM 100 MG/ML IV SOLN
2000.0000 mg | Freq: Once | INTRAVENOUS | Status: AC
  Administered 2023-12-09: 2000 mg via INTRAVENOUS
  Filled 2023-12-09: qty 20

## 2023-12-09 MED ORDER — HALOPERIDOL LACTATE 5 MG/ML IJ SOLN
5.0000 mg | Freq: Once | INTRAMUSCULAR | Status: AC
  Administered 2023-12-09: 5 mg via INTRAVENOUS

## 2023-12-09 MED ORDER — ORAL CARE MOUTH RINSE: 15.0000 mL | OROMUCOSAL | Status: DC | PRN

## 2023-12-09 MED ORDER — LORAZEPAM 2 MG/ML IJ SOLN: 0.5000 mg | INTRAMUSCULAR | Status: DC | PRN

## 2023-12-09 MED ORDER — ORAL CARE MOUTH RINSE
15.0000 mL | OROMUCOSAL | Status: DC
  Administered 2023-12-09 – 2023-12-11 (×6): 15 mL via OROMUCOSAL
  Filled 2023-12-09 (×34): qty 15

## 2023-12-09 MED ORDER — LORAZEPAM 2 MG/ML IJ SOLN
1.0000 mg | Freq: Once | INTRAMUSCULAR | Status: AC
  Administered 2023-12-09: 1 mg via INTRAVENOUS
  Filled 2023-12-09: qty 1

## 2023-12-09 MED ORDER — MIDAZOLAM HCL (PF) 10 MG/2ML IJ SOLN
INTRAMUSCULAR | Status: AC
  Administered 2023-12-09: 5 mg
  Filled 2023-12-09: qty 2

## 2023-12-09 MED ORDER — HEPARIN (PORCINE) 25000 UT/250ML-% IV SOLN
1450.0000 [IU]/h | INTRAVENOUS | Status: DC
  Administered 2023-12-09 (×2): 1450 [IU]/h via INTRAVENOUS
  Filled 2023-12-09 (×2): qty 250

## 2023-12-09 MED ORDER — LORAZEPAM 2 MG/ML IJ SOLN
0.5000 mg | INTRAMUSCULAR | Status: DC | PRN
  Administered 2023-12-09 – 2023-12-10 (×9): 0.5 mg via INTRAVENOUS
  Filled 2023-12-09 (×9): qty 1

## 2023-12-09 MED ORDER — HYDRALAZINE HCL 20 MG/ML IJ SOLN: 10.0000 mg | INTRAMUSCULAR | Status: DC | PRN

## 2023-12-09 MED ORDER — MIDAZOLAM-SODIUM CHLORIDE 100-0.9 MG/100ML-% IV SOLN
0.5000 mg/h | INTRAVENOUS | Status: DC
Start: 2023-12-09 — End: 2023-12-09
  Administered 2023-12-09: 0.5 mg/h via INTRAVENOUS
  Filled 2023-12-09: qty 100

## 2023-12-09 MED ORDER — ACETAMINOPHEN 325 MG PO TABS
650.0000 mg | ORAL_TABLET | ORAL | Status: DC | PRN
Start: 2023-12-09 — End: 2023-12-12

## 2023-12-09 MED ORDER — LEVETIRACETAM IN NACL 1000 MG/100ML IV SOLN
1000.0000 mg | Freq: Once | INTRAVENOUS | Status: AC
  Administered 2023-12-09: 1000 mg via INTRAVENOUS
  Filled 2023-12-09: qty 100

## 2023-12-09 MED ORDER — POTASSIUM CHLORIDE 10 MEQ/100ML IV SOLN
10.0000 meq | Freq: Once | INTRAVENOUS | Status: AC
Start: 2023-12-09 — End: 2023-12-09
  Administered 2023-12-09: 10 meq via INTRAVENOUS
  Filled 2023-12-09: qty 100

## 2023-12-09 MED ORDER — LORAZEPAM 2 MG/ML IJ SOLN
INTRAMUSCULAR | Status: AC
  Administered 2023-12-09: 2 mg via INTRAVENOUS
  Filled 2023-12-09: qty 1

## 2023-12-09 MED ORDER — POTASSIUM CHLORIDE 10 MEQ/100ML IV SOLN
10.0000 meq | INTRAVENOUS | Status: AC
Start: 2023-12-09 — End: 2023-12-09
  Administered 2023-12-09 (×3): 10 meq via INTRAVENOUS
  Filled 2023-12-09 (×3): qty 100

## 2023-12-09 NOTE — ED Notes (Addendum)
Patient currently getting echo completed in room.

## 2023-12-09 NOTE — Assessment & Plan Note (Addendum)
Noted generalized tonic-clonic activity overnight w/ setting of baseline dementia  Persistent generalized tremor and agitation despite prn high dose versed and haldol  Protecting airway S/p keppra load in ER  MRI brain EEG  Will formally consult neurology for further recommendations

## 2023-12-09 NOTE — ED Notes (Signed)
Pt to CT

## 2023-12-09 NOTE — Consult Note (Signed)
NEUROLOGY CONSULT NOTE   Date of service: December 09, 2023 Patient Name: Bryan [name] MRN:  [id number] DOB:  1961-05-31 Chief Complaint: "Seizure" Requesting Provider: Hannah Beat, MD  History of Present Illness  Bryan [name] is a 63 y.o. male with hx of dementia that is diagnosed at age 64.  He presents with new onset seizure that occurred this morning.  He went to the bathroom, and she heard a crash and went in to find him.  He was still cognizant, and did not appear to have hit his head.  Subsequently, however he had seizure-like activity which his wife describes his arms bilaterally outstretched shaking, lasting approximately 1 minute.  He was postictal.  He was transported and was combative during transport.  Since arrival, he has been given 7 mg of Haldol, 3.5 mg of lorazepam and 10 mg of Versed.  He continues to be confused, but is somewhat arousable.  He has never had a seizure prior to this.  At baseline, he is still able to choose his clothing most of the time, though he does need help sometimes.  He is still able to eat with a knife and fork, but cannot prepare his own meals.  He is able to recognize most family members most of the time, but has trouble recommended seeing people he does not see as regularly.  Past History   Past Medical History:  Diagnosis Date   Hypertension     Past Surgical History:  Procedure Laterality Date   BACK SURGERY     BACK SURGERY  ankle acl right   HERNIA REPAIR      Family History: Family History  Problem Relation Age of Onset   Heart failure Father    Cancer Sister     Social History  reports that he quit smoking about 44 years ago. His smoking use included cigarettes. He does not have any smokeless tobacco history on file. He reports current alcohol use. He reports that he does not use drugs.  No Known Allergies  Medications   Current Facility-Administered Medications:    0.9 %  sodium chloride infusion, 75 mL/hr,  Intravenous, Continuous, Bryan Flock, MD, Last Rate: 75 mL/hr at 12/09/23 0913, 75 mL/hr at 12/09/23 0913   acetaminophen (TYLENOL) tablet 650 mg, 650 mg, Oral, Q4H PRN, Bryan Flock, MD   [START ON 12/10/2023] aspirin EC tablet 81 mg, 81 mg, Oral, Daily, Bryan Flock, MD   heparin ADULT infusion 100 units/mL (25000 units/273mL), 1,450 Units/hr, Intravenous, Continuous, Mansy, Jan A, MD, Last Rate: 14.5 mL/hr at 12/09/23 0645, 1,450 Units/hr at 12/09/23 0645   LORazepam (ATIVAN) injection 0.5 mg, 0.5 mg, Intravenous, Q2H PRN, Bryan Flock, MD, 0.5 mg at 12/09/23 0912   LORazepam (ATIVAN) injection 1 mg, 1 mg, Intravenous, Once, Bryan Flock, MD   nitroGLYCERIN (NITROSTAT) SL tablet 0.4 mg, 0.4 mg, Sublingual, Q5 Min x 3 PRN, Bryan Flock, MD   ondansetron [facility]) injection 4 mg, 4 mg, Intravenous, Q6H PRN, Bryan Flock, MD   Oral care mouth rinse, 15 mL, Mouth Rinse, Q2H, Bryan Flock, MD   Oral care mouth rinse, 15 mL, Mouth Rinse, PRN, Bryan Flock, MD   potassium chloride 10 mEq in 100 mL IVPB, 10 mEq, Intravenous, Q1 Hr x 3, Bryan Flock, MD, Last Rate: 100 mL/hr at 12/09/23 0917, 10 mEq at 12/09/23 0917  Current Outpatient Medications:    atorvastatin (LIPITOR) 10 MG tablet, Take 10 mg  by mouth at bedtime., Disp: , Rfl:    celecoxib (CELEBREX) 200 MG capsule, Take 200 mg by mouth 2 (two) times daily., Disp: , Rfl:    hydrochlorothiazide (HYDRODIURIL) 25 MG tablet, Take 1 tablet by mouth daily., Disp: , Rfl:    Melatonin 5 MG CHEW, Chew 10 mg by mouth at bedtime., Disp: , Rfl:    mirtazapine (REMERON) 15 MG tablet, Take 15 mg by mouth at bedtime., Disp: , Rfl:    telmisartan (MICARDIS) 40 MG tablet, Take 40 mg by mouth at bedtime., Disp: , Rfl:    traZODone (DESYREL) 50 MG tablet, Take 50 mg by mouth at bedtime., Disp: , Rfl:    verapamil (CALAN-SR) 240 MG CR tablet, Take 240 mg by mouth daily., Disp: , Rfl:    amoxicillin-clavulanate (AUGMENTIN)  875-125 MG per tablet, Take 1 tablet by mouth every 12 (twelve) hours. (Patient not taking: Reported on Dec 24, 2023), Disp: 14 tablet, Rfl: 0   chlorpheniramine-HYDROcodone (TUSSIONEX PENNKINETIC ER) 10-8 MG/5ML SUER, Take 5 mLs by mouth every 12 (twelve) hours as needed for cough. (Patient not taking: Reported on 12/24/23), Disp: 115 mL, Rfl: 0   furosemide (LASIX) 20 MG tablet, Take 20 mg by mouth. (Patient not taking: Reported on 2023-12-24), Disp: , Rfl:    HYDROcodone-acetaminophen (NORCO/VICODIN) 5-325 MG per tablet, Take 1 tablet by mouth every 6 (six) hours as needed for moderate pain. (Patient not taking: Reported on 2023/12/24), Disp: , Rfl:    ipratropium (ATROVENT) 0.06 % nasal spray, Place 2 sprays into both nostrils every 4 (four) hours as needed for rhinitis. (Patient not taking: Reported on 24-Dec-2023), Disp: 15 mL, Rfl: 1   predniSONE (DELTASONE) 10 MG tablet, 4 tabs PO QD for 4 days; 3 tabs PO QD for 3 days; 2 tabs PO QD for 2 days; 1 tab PO QD for 1 day (Patient not taking: Reported on 24-Dec-2023), Disp: 30 tablet, Rfl: 0   temazepam (RESTORIL) 30 MG capsule, Take 30 mg by mouth at bedtime as needed for sleep. (Patient not taking: Reported on 12-24-23), Disp: , Rfl:   Vitals   Vitals:   December 24, 2023 0409 12-24-2023 0528 12-24-23 0620 December 24, 2023 0715  BP:    126/78  Pulse:    92  Resp:    (!) 21  Temp: (!) 96.2 F (35.7 C) 97.8 F (36.6 C)    TempSrc: Axillary     SpO2:    97%  Weight:   123.8 kg   Height:   6' (1.829 m)     Body mass index is 37.02 kg/m.  Physical Exam   Constitutional: Appears well-developed and well-nourished.   Neurologic Examination    Neuro: Mental Status: Patient is lethargic but arousable, he is able to tell me his name, he states that is his sister who is accompanying him when it is actually his wife, he is able to follow commands to wiggle toes and show thumbs Cranial Nerves: II: He fixates and tracks but does not blink to threat. Pupils are  equal, round, and reactive to light.   III,IV, VI: Eyes are slightly disconjugate but become more conjugate V, VII: Blinks to eyelid stimulation bilaterally Motor: He withdraws to noxious stimulation in all four extremities Sensory: Response to noxious stimulation x 4 Cerebellar: Does not perform       Labs/Imaging/Neurodiagnostic studies   CBC:  Recent Labs  Lab Dec 24, 2023 0304  WBC 9.5  HGB 15.7  HCT 45.3  MCV 82.2  PLT 165   Basic Metabolic  Panel:  Lab Results  Component Value Date   NA 140 12/09/2023   K 3.3 (L) 12/09/2023   CO2 24 12/09/2023   GLUCOSE 146 (H) 12/09/2023   BUN 11 12/09/2023   CREATININE 0.97 12/09/2023   CALCIUM 8.6 (L) 12/09/2023   GFRNONAA >60 12/09/2023   GFRAA  06/18/2009    >60        The eGFR has been calculated using the MDRD equation. This calculation has not been validated in all clinical situations. eGFR's persistently <60 mL/min signify possible Chronic Kidney Disease.   Lipid Panel: No results found for: "LDLCALC" HgbA1c: No results found for: "HGBA1C" Urine Drug Screen:     Component Value Date/Time   LABOPIA NONE DETECTED 12/09/2023 0301   COCAINSCRNUR NONE DETECTED 12/09/2023 0301   LABBENZ POSITIVE (A) 12/09/2023 0301   AMPHETMU NONE DETECTED 12/09/2023 0301   THCU NONE DETECTED 12/09/2023 0301   LABBARB NONE DETECTED 12/09/2023 0301    Alcohol Level     Component Value Date/Time   ETH <10 12/09/2023 0304   INR  Lab Results  Component Value Date   INR 1.0 06/18/2009   APTT  Lab Results  Component Value Date   APTT 29 06/18/2009   AED levels: No results found for: "PHENYTOIN", "ZONISAMIDE", "LAMOTRIGINE", "LEVETIRACETA"  CT Head without contrast(Personally reviewed): CT head is unremarkable    ASSESSMENT   Yunus A Mcgregor is a 63 y.o. male with a past medical history of hypertension and dementia who presents with new onset seizure.  My suspicion is that the predominance of his current mental status  is sedative medications, given that he is arousable and able to follow commands I have low suspicion for ongoing seizure.  People with dementia are certainly at risk for developing seizures and I would favor starting an antiepileptic at this time.  Given the reported agitation, I am not certain that Keppra would be the best choice and therefore I will load with Depakote.  RECOMMENDATIONS  Depacon 2 g x 1 I will use Depacon 500 mg 3 times daily until he is able to take p.o. at which time we can change to Depakote 750 twice daily EEG MRI Check magnesium Neurology will follow ______________________________________________________________________    Signed, Ritta Slot, MD [facility]

## 2023-12-09 NOTE — ED Notes (Signed)
Pt combative in CT. Verbal orders Dr Dolores Frame 5mg  haldol IV

## 2023-12-09 NOTE — ED Notes (Signed)
Spoke with MRI and informed them do not believe patient will be able to withstand scan at this time due to patient moving around and being agitated majority of this RN's shift thus far. MRI spoke with wife to do screening. Wife also shared them same information with MRI that she does not believe patient will manage MRI scan at this time.

## 2023-12-09 NOTE — ED Notes (Signed)
Pt repositioned in bed, new blanket provided. Continues to be restless and agitated

## 2023-12-09 NOTE — Progress Notes (Signed)
Eeg done

## 2023-12-09 NOTE — ED Notes (Addendum)
Dr Dolores Frame notified trop 213. Orders to be placed as needed.  Apply bair hugger per Dr Dolores Frame

## 2023-12-09 NOTE — ED Notes (Signed)
Pt placed on NRB per Dr Dolores Frame

## 2023-12-09 NOTE — Procedures (Signed)
History: 63 year old male with new onset seizure in the setting of dementia  Sedation: Ativan and Haldol  Patient State: Awake and asleep  Technique: This EEG was acquired with electrodes placed according to the International 10-20 electrode system (including Fp1, Fp2, F3, F4, C3, C4, P3, P4, O1, O2, T3, T4, T5, T6, A1, A2, Fz, Cz, Pz). The following electrodes were missing or displaced: none.   Background: The background consists predominantly of generalized irregular slow activities with occasional periods where a posterior dominant rhythm that is poorly formed of 7 to 8 Hz is seen.  Sleep spindles are seen at times and are symmetric.  Photic stimulation: Physiologic driving is not performed  EEG Abnormalities: 1) generalized irregular slow activity 2) slow posterior dominant rhythm  Clinical Interpretation: This EEG is consistent with a generalized nonspecific cerebral dysfunction (encephalopathy).  There was no seizure or seizure predisposition recorded on this study. Please note that lack of epileptiform activity on EEG does not preclude the possibility of epilepsy.   Ritta Slot, MD [facility] [phone]  If 7pm- 7am, please page neurology on call as listed in [facility]ION.

## 2023-12-09 NOTE — Progress Notes (Signed)
PHARMACY - ANTICOAGULATION CONSULT NOTE  Pharmacy Consult for Heparin  Indication: chest pain/ACS  No Known Allergies  Patient Measurements: Height: 6' (182.9 cm) Weight: 123.8 kg (272 lb 14.9 oz) IBW/kg (Calculated) : 77.6 Heparin Dosing Weight: 105 kg   Vital Signs: Temp: 98 F (36.7 C) (01/29 1330) Temp Source: Axillary (01/29 0409) BP: 109/90 (01/29 1300) Pulse Rate: 86 (01/29 1300)  Labs: Recent Labs    12/09/23 0304 12/09/23 0451 12/09/23 0902 12/09/23 1205 12/09/23 1228  HGB 15.7  --   --   --   --   HCT 45.3  --   --   --   --   PLT 165  --   --   --   --   HEPARINUNFRC  --   --   --   --  0.39  CREATININE 0.97  --   --   --   --   CKTOTAL  --  402*  --   --   --   TROPONINIHS 213* 638* 951* 871*  --     Estimated Creatinine Clearance: 104.6 mL/min (by C-G formula based on SCr of 0.97 mg/dL).   Medical History: Past Medical History:  Diagnosis Date   Hypertension     Medications:  (Not in a hospital admission)   Assessment: 63 y.o. male with PMH including HTN is presenting with new onset seizure and ACS. QT prolonged at 557. Patient is not on chronic anticoagulation per chart review. Pharmacy has been consulted to initiate and manage heparin intravenous infusion.  Goal of Therapy:  Heparin level 0.3-0.7 units/ml Monitor platelets by anticoagulation protocol: Yes  Date Time aPTT/HL Rate/Comment 1/29 1228 HL 0.39 Therapeutic x 1       Plan:  Continue heparin infusion at 1450 units/hr Check anti-Xa level in 6 hours and daily while on heparin Continue to monitor H&H and platelets  Will M. Dareen Piano, PharmD Clinical Pharmacist 12/09/2023 1:39 PM

## 2023-12-09 NOTE — ED Notes (Signed)
Pt able to provide urine sample without in and out cath

## 2023-12-09 NOTE — ED Notes (Signed)
Dr Alvester Morin notified of trop increased to 951. No new orders at this time

## 2023-12-09 NOTE — ED Notes (Signed)
Dr Arville Care notified of trop increased to 638. Orders placed as needed

## 2023-12-09 NOTE — ED Notes (Signed)
Bair hugger removed

## 2023-12-09 NOTE — ED Notes (Signed)
This RN called to bedside by family, reports pt is increasingly agitated and combative. Prn meds administered.

## 2023-12-09 NOTE — ED Notes (Signed)
Pt becoming agitated, trying to take off monitors and get out of bed. Dr Arville Care messaged for prn orders.  Toileting needs addressed, lights dimmed for comfort. 1:1 sitter remains at bedside for safety

## 2023-12-09 NOTE — ED Triage Notes (Signed)
Pt arrives via EMS from home for a seizure. Wife found pt on floor in bathroom having a seizure. EMS gave 10 mg of versed and 5 mg of haldol. Pt was combative for EMS. Hx of seizures, dementia, and falls.  EMS vitals: 154/87 97% on 4L [place]

## 2023-12-09 NOTE — ED Notes (Signed)
Pt continues to be restless, pulling at monitors. Family at bedside assisting in redirection of pt.

## 2023-12-09 NOTE — Progress Notes (Signed)
PHARMACY - ANTICOAGULATION CONSULT NOTE  Pharmacy Consult for Heparin  Indication: chest pain/ACS  No Known Allergies  Patient Measurements: Height: 6' (182.9 cm) Weight: 123.8 kg (272 lb 14.9 oz) IBW/kg (Calculated) : 77.6 Heparin Dosing Weight: 105 kg   Vital Signs: Temp: 97.8 F (36.6 C) (01/29 0528) Temp Source: Axillary (01/29 0409) BP: 106/68 (01/29 0256) Pulse Rate: 79 (01/29 0256)  Labs: Recent Labs    12/09/23 0304 12/09/23 0451  HGB 15.7  --   HCT 45.3  --   PLT 165  --   CREATININE 0.97  --   CKTOTAL  --  402*  TROPONINIHS 213* 638*    Estimated Creatinine Clearance: 104.6 mL/min (by C-G formula based on SCr of 0.97 mg/dL).   Medical History: Past Medical History:  Diagnosis Date   Hypertension     Medications:  (Not in a hospital admission)   Assessment: Pharmacy consulted to dose heparin in this 63 year old male admitted with ACS/NSTEMI.   No prior anticoag noted.  CrCl = 104.6 ml/min  Goal of Therapy:  Heparin level 0.3-0.7 units/ml Monitor platelets by anticoagulation protocol: Yes   Plan:  Give 4000 units bolus x 1 Start heparin infusion at 1450 units/hr Check anti-Xa level in 6 hours and daily while on heparin Continue to monitor H&H and platelets  Sarthak Rubenstein D 12/09/2023,6:39 AM

## 2023-12-09 NOTE — Assessment & Plan Note (Signed)
Trop 200s-->600s in setting of new onset seizure  No reported active CP  EKG grossly stable though w/ prolonged QT  Suspect secondary demand ischemia assd w/ seizure event  Started on heparin gtt in ER  Will continue  2D ECHO  Risk stratification labs  Will reach out to cardiology

## 2023-12-09 NOTE — ED Provider Notes (Signed)
[facility] Provider Note    Event Date/Time   First MD Initiated Contact with Patient 12/09/23 0258     (approximate)   History   Seizures   HPI  Level V caveat: Limited by decreased LOC  Bryan [name] is a 63 y.o. male brought to the ED via EMS from home status post seizure.  Wife reports to EMS patient with a history of seizure but does not currently take any medications.  EMS witnessed patient postictal and combative.  Gave 5 mg IM Versed and 5 mg IM Haldol without success.  Called medical control for further orders and given additional 5 mg Versed via IV.  Patient ripped out his IV and round.  Arrives to the ED asleep, sedated.  Wife denied history of EtOH.   Past Medical History   Past Medical History:  Diagnosis Date   Hypertension      Active Problem List   Patient Active Problem List   Diagnosis Date Noted   Seizure (HCC) 12/09/2023     Past Surgical History   Past Surgical History:  Procedure Laterality Date   BACK SURGERY     BACK SURGERY  ankle acl right   HERNIA REPAIR       Home Medications   Prior to Admission medications   Medication Sig Start Date End Date Taking? Authorizing Provider  amoxicillin-clavulanate (AUGMENTIN) 875-125 MG per tablet Take 1 tablet by mouth every 12 (twelve) hours. 03/25/15   Baker, Adrian Blackwater, PA-C  chlorpheniramine-HYDROcodone (TUSSIONEX PENNKINETIC ER) 10-8 MG/5ML SUER Take 5 mLs by mouth every 12 (twelve) hours as needed for cough. 03/25/15   Baker, Adrian Blackwater, PA-C  furosemide (LASIX) 20 MG tablet Take 20 mg by mouth.    [provider]  HYDROcodone-acetaminophen (NORCO/VICODIN) 5-325 MG per tablet Take 1 tablet by mouth every 6 (six) hours as needed for moderate pain.    [provider]  ipratropium (ATROVENT) 0.06 % nasal spray Place 2 sprays into both nostrils every 4 (four) hours as needed for rhinitis. 03/25/15   Excell Seltzer, Adrian Blackwater, PA-C  predniSONE (DELTASONE) 10 MG  tablet 4 tabs PO QD for 4 days; 3 tabs PO QD for 3 days; 2 tabs PO QD for 2 days; 1 tab PO QD for 1 day 03/25/15   Autumn Messing H, PA-C  temazepam (RESTORIL) 30 MG capsule Take 30 mg by mouth at bedtime as needed for sleep.    [provider]     Allergies  Patient has no known allergies.   Family History   Family History  Problem Relation Age of Onset   Heart failure Father    Cancer Sister      Physical Exam  Triage Vital Signs: ED Triage Vitals [12/09/23 0256]  Encounter Vitals Group     BP      Systolic BP Percentile      Diastolic BP Percentile      Pulse Rate 79     Resp 18     Temp      Temp src      SpO2      Weight      Height      Head Circumference      Peak Flow      Pain Score      Pain Loc      Pain Education      Exclude from Growth Chart     Updated Vital Signs: BP 106/68  Pulse 79   Temp (!) 96.2 F (35.7 C) (Axillary)   Resp 18   SpO2 91%    General: Asleep, moderate distress.  CV:  RRR.  Good peripheral perfusion.  Resp:  Normal effort.  Diminished, occasional coarse breath sounds. Abd:  Nontender.  No distention.  Other:  Pinpoint pupils.  Reacts to painful stimuli.   ED Results / Procedures / Treatments  Labs (all labs ordered are listed, but only abnormal results are displayed) Labs Reviewed  COMPREHENSIVE METABOLIC PANEL - Abnormal; Notable for the following components:      Result Value   Potassium 3.3 (*)    Glucose, Bld 146 (*)    Calcium 8.6 (*)    All other components within normal limits  ACETAMINOPHEN LEVEL - Abnormal; Notable for the following components:   Acetaminophen (Tylenol), Serum <10 (*)    All other components within normal limits  SALICYLATE LEVEL - Abnormal; Notable for the following components:   Salicylate Lvl <7.0 (*)    All other components within normal limits  URINALYSIS, ROUTINE W REFLEX MICROSCOPIC - Abnormal; Notable for the following components:   Color, Urine YELLOW (*)     APPearance CLEAR (*)    Hgb urine dipstick SMALL (*)    Protein, ur 100 (*)    All other components within normal limits  URINE DRUG SCREEN, QUALITATIVE ([facility] ONLY) - Abnormal; Notable for the following components:   Benzodiazepine, Ur Scrn POSITIVE (*)    All other components within normal limits  TROPONIN I (HIGH SENSITIVITY) - Abnormal; Notable for the following components:   Troponin I (High Sensitivity) 213 (*)    All other components within normal limits  RESP PANEL BY RT-PCR (RSV, FLU A&B, COVID)  [id number]  CBC  ETHANOL  CK     EKG  ED ECG REPORT I, Bowyn Mercier J, the attending physician, personally viewed and interpreted this ECG.   Date: 12/09/2023  EKG Time: 0357  Rate: 81  Rhythm: normal sinus rhythm  Axis: Normal  Intervals: QTc 557  ST&T Change: Nonspecific    RADIOLOGY I have independently visualized and interpreted patient's imaging studies as well as noted the radiology interpretation:  CT head: No ICH  Chest x-ray: Pulmonary vascular congestion  Official radiology report(s): CT Head Wo Contrast Result Date: 12/09/2023 CLINICAL DATA:  Mental status change EXAM: CT HEAD WITHOUT CONTRAST TECHNIQUE: Contiguous axial images were obtained from the base of the skull through the vertex without intravenous contrast. RADIATION DOSE REDUCTION: This exam was performed according to the departmental dose-optimization program which includes automated exposure control, adjustment of the mA and/or kV according to patient size and/or use of iterative reconstruction technique. COMPARISON:  MRI brain 12/28/2019 FINDINGS: Brain: No evidence of acute infarction, hemorrhage, hydrocephalus, extra-axial collection or mass lesion/mass effect. Again seen is mild diffuse atrophy and mild periventricular white matter hypodensity, likely chronic small vessel ischemic change. Vascular: No hyperdense vessel or unexpected calcification. Skull: Normal. Negative for fracture or focal lesion.  Sinuses/Orbits: No acute finding. Other: None. IMPRESSION: 1. No acute intracranial process. 2. Mild diffuse atrophy and mild chronic small vessel ischemic change. Electronically Signed   By: Darliss Cheney M.D.   On: 12/09/2023 04:01   DG Chest Port 1 View Result Date: 12/09/2023 CLINICAL DATA:  Seizure EXAM: PORTABLE CHEST 1 VIEW COMPARISON:  Chest x-ray 11/27/2008 FINDINGS: The cardiomediastinal silhouette appears enlarged, a new finding. There central pulmonary vascular congestion. Lung volumes are low. There is no focal lung infiltrate, pleural effusion  or pneumothorax. No acute fractures are seen. IMPRESSION: 1. New enlarged cardiomediastinal silhouette. Findings may be related to patient positioning. Consider follow-up PA and lateral chest x-ray when patient's condition permits. 2. Central pulmonary vascular congestion. Electronically Signed   By: Darliss Cheney M.D.   On: 12/09/2023 03:56     PROCEDURES:  Critical Care performed: Yes, see critical care procedure note(s)  CRITICAL CARE Performed by: Irean Hong   Total critical care time: 45 minutes  Critical care time was exclusive of separately billable procedures and treating other patients.  Critical care was necessary to treat or prevent imminent or life-threatening deterioration.  Critical care was time spent personally by me on the following activities: development of treatment plan with patient and/or surrogate as well as nursing, discussions with consultants, evaluation of patient's response to treatment, examination of patient, obtaining history from patient or surrogate, ordering and performing treatments and interventions, ordering and review of laboratory studies, ordering and review of radiographic studies, pulse oximetry and re-evaluation of patient's condition.   Marland Kitchen1-3 Lead EKG Interpretation  Performed by: Irean Hong, MD Authorized by: Irean Hong, MD     Interpretation: normal     ECG rate:  80   ECG rate  assessment: normal     Rhythm: sinus rhythm     Ectopy: none     Conduction: normal   Comments:     Patient placed on cardiac monitor to evaluate for arrhythmias    MEDICATIONS ORDERED IN ED: Medications  potassium chloride 10 mEq in 100 mL IVPB (has no administration in time range)  sodium chloride 0.9 % bolus 1,000 mL (1,000 mLs Intravenous New Bag/Given 12/09/23 0318)  levETIRAcetam (KEPPRA) IVPB 1000 mg/100 mL premix (0 mg Intravenous Stopped 12/09/23 0352)  haloperidol lactate (HALDOL) injection 5 mg ( Intravenous Not Given 12/09/23 0402)  LORazepam (ATIVAN) injection 2 mg (2 mg Intravenous Given 12/09/23 0333)  midazolam PF (VERSED) 10 MG/2ML injection (5 mg  Given 12/09/23 0343)     IMPRESSION / MDM / ASSESSMENT AND PLAN / ED COURSE  I reviewed the triage vital signs and the nursing notes.                             63 year old male brought for seizure x 1.  Postictal and route, given IM and IV sedatives.  Differential diagnosis includes but is not limited to ICH, CVA, ACS, metabolic, infectious etiologies, etc.  I personally reviewed patient's records and note he is a [place] patient and unfortunately I am unable to view his records.  Patient's presentation is most consistent with acute presentation with potential threat to life or bodily function.  The patient is on the cardiac monitor to evaluate for evidence of arrhythmia and/or significant heart rate changes.  Will obtain lab work, UA, CT head, chest x-ray for suspicion of aspiration.  Will convert nasal cannula to facemask as patient is mouth breathing.  Seizure precautions.  Initiate IV fluid hydration, seizure prophylaxis with 1 g IV Keppra.  Will reassess.  Clinical Course as of 12/09/23 0420  Wed Dec 09, 2023  0341 Patient's wife and daughter now at bedside.  States he sees a neurologist for his dementia.  Will occasionally have muscle twitching after falling but wife states this is a first-time tonic-clonic seizure lasting  a few minutes. [JS]  0354 Patient required additional IV calming medications.  Quite calm after additional IV Versed. [JS]  0417 Elevated troponin  likely secondary to demand ischemia; will repeat.  IV potassium ordered for mild hypokalemia.  Will apply Bair hugger for hypothermia. [JS]    Clinical Course User Index [JS] Irean Hong, MD     FINAL CLINICAL IMPRESSION(S) / ED DIAGNOSES   Final diagnoses:  Seizure (HCC)  Hypothermia, initial encounter  Hypokalemia     Rx / DC Orders   ED Discharge Orders     None        Note:  This document was prepared using Dragon voice recognition software and may include unintentional dictation errors.   Irean Hong, MD 12/09/23 [phone]

## 2023-12-09 NOTE — H&P (Addendum)
History and Physical    Patient: Bryan [name] EYC:[id number] DOB: Oct 13, 1961 DOA: 12/09/2023 DOS: the patient was seen and examined on 12/09/2023 [facility]  Patient coming from: Home  Chief Complaint:  Chief Complaint  Patient presents with   Seizures   HPI: Bryan [name] is a 63 y.o. male with medical history significant of hypertension, dementia presenting with new onset seizure, agitation, NSTEMI, prolonged QT.  Limited history in the setting of recent seizure.  Per the wife, patient with noted episode of generalized tonic-clonic event around 3:00 this morning after using the bathroom.  Wife denies any known prior episodes like this in the past.  Wife does report patient with progressive dementia for the past 4 to 5 years.  Followed by the [facility].  In review of the chart, looks to have been seen by [facility] neurology around April 2021.  Had a EEG that was not indicative of seizure at that time.  No reported recent medication changes.  No nausea or vomiting.  Baseline hypertension.  On multiple medications.  EMS subsequently called with patient receiving 10 mg of Versed as well as 5 mg of Haldol.  Patient still combative despite medication. Presented to the ER afebrile, hemodynamically stable.  On 2 to 4 L to keep O2 sats greater than 97%.  White count 9.5, hemoglobin 15.7, platelets 165, creatinine 0.97, glucose 146, potassium 3.3, troponin 200s to 600s.  CK4 02.  EKG with transient sinus rhythm and movement artifact.  Status post IV Keppra load in the ER.  Still persistent agitation despite an additional 5 mg of Versed as well as Haldol. CT head and CXR grossly stable.   Review of Systems: As mentioned in the history of present illness. All other systems reviewed and are negative. Past Medical History:  Diagnosis Date   Hypertension    Past Surgical History:  Procedure Laterality Date   BACK SURGERY     BACK SURGERY  ankle acl right   HERNIA REPAIR     Social History:   reports that he quit smoking about 44 years ago. His smoking use included cigarettes. He does not have any smokeless tobacco history on file. He reports current alcohol use. He reports that he does not use drugs.  No Known Allergies  Family History  Problem Relation Age of Onset   Heart failure Father    Cancer Sister     Prior to Admission medications   Medication Sig Start Date End Date Taking? Authorizing Provider  atorvastatin (LIPITOR) 10 MG tablet Take 10 mg by mouth at bedtime. 02/08/15  Yes [provider]  celecoxib (CELEBREX) 200 MG capsule Take 200 mg by mouth 2 (two) times daily. 03/17/23  Yes [provider]  hydrochlorothiazide (HYDRODIURIL) 25 MG tablet Take 1 tablet by mouth daily. 11/28/15  Yes [provider]  Melatonin 5 MG CHEW Chew 10 mg by mouth at bedtime.   Yes [provider]  mirtazapine (REMERON) 15 MG tablet Take 15 mg by mouth at bedtime. 06/23/23  Yes [provider]  telmisartan (MICARDIS) 40 MG tablet Take 40 mg by mouth at bedtime. 01/26/23  Yes [provider]  traZODone (DESYREL) 50 MG tablet Take 50 mg by mouth at bedtime. 06/29/19  Yes [provider]  verapamil (CALAN-SR) 240 MG CR tablet Take 240 mg by mouth daily. 07/21/23  Yes [provider]  amoxicillin-clavulanate (AUGMENTIN) 875-125 MG per tablet Take 1 tablet by mouth every 12 (twelve) hours. Patient not taking:  Reported on 12/09/2023 03/25/15   Graylon Good, PA-C  chlorpheniramine-HYDROcodone [facility] PENNKINETIC ER) 10-8 MG/5ML SUER Take 5 mLs by mouth every 12 (twelve) hours as needed for cough. Patient not taking: Reported on 12/09/2023 03/25/15   Graylon Good, PA-C  furosemide (LASIX) 20 MG tablet Take 20 mg by mouth. Patient not taking: Reported on 12/09/2023    [provider]  HYDROcodone-acetaminophen (NORCO/VICODIN) 5-325 MG per tablet Take 1 tablet by mouth every 6 (six) hours as needed for moderate  pain. Patient not taking: Reported on 12/09/2023    [provider]  ipratropium (ATROVENT) 0.06 % nasal spray Place 2 sprays into both nostrils every 4 (four) hours as needed for rhinitis. Patient not taking: Reported on 12/09/2023 03/25/15   Graylon Good, PA-C  predniSONE (DELTASONE) 10 MG tablet 4 tabs PO QD for 4 days; 3 tabs PO QD for 3 days; 2 tabs PO QD for 2 days; 1 tab PO QD for 1 day Patient not taking: Reported on 12/09/2023 03/25/15   Autumn Messing H, PA-C  temazepam (RESTORIL) 30 MG capsule Take 30 mg by mouth at bedtime as needed for sleep. Patient not taking: Reported on 12/09/2023    [provider]    Physical Exam: Vitals:   12/09/23 0409 12/09/23 0528 12/09/23 0620 12/09/23 0715  BP:    126/78  Pulse:    92  Resp:    (!) 21  Temp: (!) 96.2 F (35.7 C) 97.8 F (36.6 C)    TempSrc: Axillary     SpO2:    97%  Weight:   123.8 kg   Height:   6' (1.829 m)    Physical Exam Constitutional:      Appearance: He is obese.     Comments: + agitation   HENT:     Head: Normocephalic and atraumatic.     Nose: Nose normal.     Mouth/Throat:     Mouth: Mucous membranes are moist.  Eyes:     Pupils: Pupils are equal, round, and reactive to light.  Cardiovascular:     Rate and Rhythm: Normal rate and regular rhythm.  Pulmonary:     Effort: Pulmonary effort is normal.  Abdominal:     General: Bowel sounds are normal.  Musculoskeletal:        General: Normal range of motion.  Skin:    General: Skin is warm.  Neurological:     Comments: Minimally verbal + generalized agitation and tremor       Data Reviewed:  There are no new results to review at this time.  CT Head Wo Contrast CLINICAL DATA:  Mental status change  EXAM: CT HEAD WITHOUT CONTRAST  TECHNIQUE: Contiguous axial images were obtained from the base of the skull through the vertex without intravenous contrast.  RADIATION DOSE REDUCTION: This exam was performed according to  the departmental dose-optimization program which includes automated exposure control, adjustment of the mA and/or kV according to patient size and/or use of iterative reconstruction technique.  COMPARISON:  MRI brain 12/28/2019  FINDINGS: Brain: No evidence of acute infarction, hemorrhage, hydrocephalus, extra-axial collection or mass lesion/mass effect. Again seen is mild diffuse atrophy and mild periventricular white matter hypodensity, likely chronic small vessel ischemic change.  Vascular: No hyperdense vessel or unexpected calcification.  Skull: Normal. Negative for fracture or focal lesion.  Sinuses/Orbits: No acute finding.  Other: None.  IMPRESSION: 1. No acute intracranial process. 2. Mild diffuse atrophy and mild chronic small vessel ischemic  change.  Electronically Signed   By: Darliss Cheney M.D.   On: 12/09/2023 04:01 DG Chest Port 1 View CLINICAL DATA:  Seizure  EXAM: PORTABLE CHEST 1 VIEW  COMPARISON:  Chest x-ray 11/27/2008  FINDINGS: The cardiomediastinal silhouette appears enlarged, a new finding. There central pulmonary vascular congestion. Lung volumes are low. There is no focal lung infiltrate, pleural effusion or pneumothorax. No acute fractures are seen.  IMPRESSION: 1. New enlarged cardiomediastinal silhouette. Findings may be related to patient positioning. Consider follow-up PA and lateral chest x-ray when patient's condition permits. 2. Central pulmonary vascular congestion.  Electronically Signed   By: Darliss Cheney M.D.   On: 12/09/2023 03:56  Lab Results  Component Value Date   WBC 9.5 12/09/2023   HGB 15.7 12/09/2023   HCT 45.3 12/09/2023   MCV 82.2 12/09/2023   PLT 165 12/09/2023   Last metabolic panel Lab Results  Component Value Date   GLUCOSE 146 (H) 12/09/2023   NA 140 12/09/2023   K 3.3 (L) 12/09/2023   CL 101 12/09/2023   CO2 24 12/09/2023   BUN 11 12/09/2023   CREATININE 0.97 12/09/2023   GFRNONAA >60  12/09/2023   CALCIUM 8.6 (L) 12/09/2023   PROT 6.9 12/09/2023   ALBUMIN 3.7 12/09/2023   BILITOT 0.7 12/09/2023   ALKPHOS 55 12/09/2023   AST 29 12/09/2023   ALT 21 12/09/2023   ANIONGAP 15 12/09/2023    Assessment and Plan: * Seizure (HCC) Noted generalized tonic-clonic activity overnight w/ setting of baseline dementia  Persistent generalized tremor and agitation despite prn high dose versed and haldol  Protecting airway S/p keppra load in ER  MRI brain EEG  Will formally consult neurology for further recommendations    Prolonged QT interval QTc in 570s on presentation  Differential includes seizure, electrolyte abnormality, NSTEMI as potential etiologies  K 3.3- replete, check Mg level  Minimize offending agents including haldol  Follow up cardiology recommendations    Dementia with behavioral disturbance (HCC) Baseline progressive dementia w/ ? Parkinsonian features per pt's mother  Followed in the [place] system  Minimize haldol given prolonged QT  Follow   NSTEMI (non-ST elevated myocardial infarction) (HCC) Trop 200s-->600s in setting of new onset seizure  No reported active CP  EKG grossly stable though w/ prolonged QT  Suspect secondary demand ischemia assd w/ seizure event  Started on heparin gtt in ER  Will continue  2D ECHO  Risk stratification labs  Will reach out to cardiology        Advance Care Planning:   Code Status: Limited: Do not attempt resuscitation (DNR) -DNR-LIMITED -Do Not Intubate/DNI    Consults: Neurology   Family Communication: Wife at the bedside   Severity of Illness: The appropriate patient status for this patient is INPATIENT. Inpatient status is judged to be reasonable and necessary in order to provide the required intensity of service to ensure the patient's safety. The patient's presenting symptoms, physical exam findings, and initial radiographic and laboratory data in the context of their chronic comorbidities is felt to  place them at high risk for further clinical deterioration. Furthermore, it is not anticipated that the patient will be medically stable for discharge from the hospital within 2 midnights of admission.   * I certify that at the point of admission it is my clinical judgment that the patient will require inpatient hospital care spanning beyond 2 midnights from the point of admission due to high intensity of service, high risk for further deterioration  and high frequency of surveillance required.*  Author: Floydene Flock, MD 12/09/2023 8:55 AM  For on call review [email or web address].

## 2023-12-09 NOTE — Assessment & Plan Note (Signed)
QTc in 570s on presentation  Differential includes seizure, electrolyte abnormality, NSTEMI as potential etiologies  K 3.3- replete, check Mg level  Minimize offending agents including haldol  Follow up cardiology recommendations

## 2023-12-09 NOTE — Assessment & Plan Note (Signed)
Baseline progressive dementia w/ ? Parkinsonian features per pt's mother  Followed in the [place] system  Minimize haldol given prolonged QT  Follow

## 2023-12-09 NOTE — Progress Notes (Signed)
PHARMACY - ANTICOAGULATION CONSULT NOTE  Pharmacy Consult for Heparin  Indication: chest pain/ACS  No Known Allergies  Patient Measurements: Height: 6' (182.9 cm) Weight: 123.8 kg (272 lb 14.9 oz) IBW/kg (Calculated) : 77.6 Heparin Dosing Weight: 105 kg   Vital Signs: Temp: 98.5 F (36.9 C) (01/29 1818) Temp Source: Axillary (01/29 1818) BP: 117/89 (01/29 1818) Pulse Rate: 86 (01/29 1818)  Labs: Recent Labs    12/09/23 0304 12/09/23 0451 12/09/23 0902 12/09/23 1205 12/09/23 1228 12/09/23 1827  HGB 15.7  --   --   --   --   --   HCT 45.3  --   --   --   --   --   PLT 165  --   --   --   --   --   HEPARINUNFRC  --   --   --   --  0.39 0.34  CREATININE 0.97  --   --   --   --   --   CKTOTAL  --  402*  --   --   --   --   TROPONINIHS 213* 638* 951* 871*  --  874*    Estimated Creatinine Clearance: 104.6 mL/min (by C-G formula based on SCr of 0.97 mg/dL).   Medical History: Past Medical History:  Diagnosis Date   Hypertension     Medications:  (Not in a hospital admission)   Assessment: 63 y.o. male with PMH including HTN is presenting with new onset seizure and ACS. QT prolonged at 557. Patient is not on chronic anticoagulation per chart review. Pharmacy has been consulted to initiate and manage heparin intravenous infusion.  Goal of Therapy:  Heparin level 0.3-0.7 units/ml Monitor platelets by anticoagulation protocol: Yes  Date Time aPTT/HL Rate/Comment 1/29 1228 HL 0.39 Therapeutic x 1  1/29 1827 HL 0.34 Therapeutic x 2      Plan:  Continue heparin infusion at 1450 units/hr Can transition to monitoring heparin levels daily given patient has been therapeutic x 2 at the current rate Continue to monitor H&H and platelets daily  Paulita Fujita, PharmD Clinical Pharmacist 12/09/2023 7:54 PM

## 2023-12-09 NOTE — ED Notes (Signed)
EEG tech at bedside.

## 2023-12-09 NOTE — ED Notes (Signed)
Called to bedside patient becoming increasingly agitated at this time.

## 2023-12-09 NOTE — ED Notes (Signed)
Pt titrated from NRB to 3 L . Dr Arville Care aware

## 2023-12-09 NOTE — ED Notes (Addendum)
Pt comabtive, taking off leads, trying to get up. Disoriented. Verbal orders Dr Dolores Frame for 2mg  ativan IV before going to CT

## 2023-12-09 NOTE — ED Notes (Signed)
Seizure pads in place

## 2023-12-09 NOTE — ED Notes (Signed)
Waiting on valproate from pharmacy

## 2023-12-09 NOTE — ED Notes (Signed)
Versed infusion stopped per Dr Alvester Morin. Bag wasted with Geographical information systems officer

## 2023-12-09 NOTE — ED Notes (Signed)
Family called RN to bedside, reports pt is increasingly agitated. Will notify Dr Alvester Morin

## 2023-12-09 NOTE — ED Notes (Addendum)
Unable to get pt to MRI at this time d/t restless, agitation, pulling out monitors. Dr Alvester Morin notified.

## 2023-12-09 NOTE — Consult Note (Addendum)
[facility] CLINIC CARDIOLOGY CONSULT NOTE       Patient ID: Bryan [name] MRN: [id number] DOB/AGE: [birth year] 65 y.o.  Admit date: 12/09/2023 Referring Physician Dr. Alvester Morin Primary Physician Administration, [facility] Cardiologist None Reason for Consultation elevated troponin  HPI: Bryan [name] is a 63 y.o. male  with a past medical history of hypertension and dementia who presented to the ED on 12/09/2023 for seizure. Troponin found to be elevated. Cardiology was consulted for further evaluation.   History obtained via patient's wife and daughter due to patient's mental status.  They report that around 1 AM this morning he got up to go to the bathroom.  On his way back to bed he fell and his wife was helping try to get him back up.  While he was leaned against the bed she reports that he went rigid and started having a seizure.  She states that the seizure lasted roughly 1 minute but afterwards for about 30 minutes he still had twitching movements.  Also reports that he was gargling.  They called EMS and shortly after they arrived he regained consciousness.  He was brought to the ED for further evaluation.  Workup in the ED notable for creatinine 0.97, potassium 3.3, hemoglobin 15.7, WBC 9.5.  CK elevated at 402.  Troponins trended 213 > 638 > 951.  EKG demonstrates sinus rhythm with PVCs, nonischemic.  CT head did not demonstrate any acute abnormalities, MRI brain to be done once patient is less agitated.  He was started on IV heparin given his elevated troponins.  At the time of evaluation this morning he is resting comfortably in ED stretcher with wife and daughter present at bedside.  He is agitated at times and does not respond to questioning but arouses to voice.  We discussed his episode earlier this morning in further detail.  His wife reports that he has had similar episodes of jerking motions in the past but no true seizures.  She reports that he was diagnosed with dementia  roughly 5 years ago and this has been progressively worsening.  States that he is able to do small tasks around the house but does not cook or clean.  She reports that roughly 1 month ago he had COVID but denies any recent illness symptoms such as fever/chills, cough.  She states that the patient has not complained of any chest pain or shortness of breath symptoms recently.  States that he is able to walk around the house without limitations.  States she reports that he has had intermittent heartburn symptoms for many years.  Review of systems complete and found to be negative unless listed above    Past Medical History:  Diagnosis Date   Hypertension     Past Surgical History:  Procedure Laterality Date   BACK SURGERY     BACK SURGERY  ankle acl right   HERNIA REPAIR      (Not in a hospital admission)  Social History   Socioeconomic History   Marital status: Married    Spouse name: Not on file   Number of children: Not on file   Years of education: Not on file   Highest education level: Not on file  Occupational History   Not on file  Tobacco Use   Smoking status: Former    Current packs/day: 0.00    Types: Cigarettes    Quit date: 03/25/1979    Years since quitting: 44.7   Smokeless tobacco: Not on file  Substance and Sexual Activity   Alcohol use: Yes   Drug use: No   Sexual activity: Yes  Other Topics Concern   Not on file  Social History Narrative   Not on file   Social Drivers of Health   Financial Resource Strain: Not on file  Food Insecurity: Not on file  Transportation Needs: Not on file  Physical Activity: Not on file  Stress: Not on file  Social Connections: Not on file  Intimate Partner Violence: Not on file    Family History  Problem Relation Age of Onset   Heart failure Father    Cancer Sister      Vitals:   12/09/23 0620 12/09/23 0715 12/09/23 0900 12/09/23 1009  BP:  126/78 (!) 121/95   Pulse:  92 71   Resp:  (!) 21 12   Temp:    98.4 F  (36.9 C)  TempSrc:      SpO2:  97% 96%   Weight: 123.8 kg     Height: 6' (1.829 m)       PHYSICAL EXAM General: Ill-appearing male, well nourished, in no acute distress. HEENT: Normocephalic and atraumatic. Neck: No JVD.  Lungs: Normal respiratory effort on 4L [place]. Clear bilaterally to auscultation. No wheezes, crackles, rhonchi.  Heart: HRRR. Normal S1 and S2 without gallops or murmurs.  Abdomen: Non-distended appearing.  Msk: Normal strength and tone for age. Extremities: Warm and well perfused. No clubbing, cyanosis.  No edema.  Neuro: Alert and oriented X 3. Psych: Answers questions appropriately.   Labs: Basic Metabolic Panel: Recent Labs    12/09/23 0304  NA 140  K 3.3*  CL 101  CO2 24  GLUCOSE 146*  BUN 11  CREATININE 0.97  CALCIUM 8.6*   Liver Function Tests: Recent Labs    12/09/23 0304  AST 29  ALT 21  ALKPHOS 55  BILITOT 0.7  PROT 6.9  ALBUMIN 3.7   No results for input(s): "LIPASE", "AMYLASE" in the last 72 hours. CBC: Recent Labs    12/09/23 0304  WBC 9.5  HGB 15.7  HCT 45.3  MCV 82.2  PLT 165   Cardiac Enzymes: Recent Labs    12/09/23 0304 12/09/23 0451  CKTOTAL  --  402*  TROPONINIHS 213* 638*   BNP: No results for input(s): "BNP" in the last 72 hours. D-Dimer: No results for input(s): "DDIMER" in the last 72 hours. Hemoglobin A1C: No results for input(s): "HGBA1C" in the last 72 hours. Fasting Lipid Panel: No results for input(s): "CHOL", "HDL", "LDLCALC", "TRIG", "CHOLHDL", "LDLDIRECT" in the last 72 hours. Thyroid Function Tests: No results for input(s): "TSH", "T4TOTAL", "T3FREE", "THYROIDAB" in the last 72 hours.  Invalid input(s): "FREET3" Anemia Panel: No results for input(s): "VITAMINB12", "FOLATE", "FERRITIN", "TIBC", "IRON", "RETICCTPCT" in the last 72 hours.   Radiology: CT Head Wo Contrast Result Date: 12/09/2023 CLINICAL DATA:  Mental status change EXAM: CT HEAD WITHOUT CONTRAST TECHNIQUE: Contiguous axial  images were obtained from the base of the skull through the vertex without intravenous contrast. RADIATION DOSE REDUCTION: This exam was performed according to the departmental dose-optimization program which includes automated exposure control, adjustment of the mA and/or kV according to patient size and/or use of iterative reconstruction technique. COMPARISON:  MRI brain 12/28/2019 FINDINGS: Brain: No evidence of acute infarction, hemorrhage, hydrocephalus, extra-axial collection or mass lesion/mass effect. Again seen is mild diffuse atrophy and mild periventricular white matter hypodensity, likely chronic small vessel ischemic change. Vascular: No hyperdense vessel or unexpected calcification. Skull:  Normal. Negative for fracture or focal lesion. Sinuses/Orbits: No acute finding. Other: None. IMPRESSION: 1. No acute intracranial process. 2. Mild diffuse atrophy and mild chronic small vessel ischemic change. Electronically Signed   By: Darliss Cheney M.D.   On: 12/09/2023 04:01   DG Chest Port 1 View Result Date: 12/09/2023 CLINICAL DATA:  Seizure EXAM: PORTABLE CHEST 1 VIEW COMPARISON:  Chest x-ray 11/27/2008 FINDINGS: The cardiomediastinal silhouette appears enlarged, a new finding. There central pulmonary vascular congestion. Lung volumes are low. There is no focal lung infiltrate, pleural effusion or pneumothorax. No acute fractures are seen. IMPRESSION: 1. New enlarged cardiomediastinal silhouette. Findings may be related to patient positioning. Consider follow-up PA and lateral chest x-ray when patient's condition permits. 2. Central pulmonary vascular congestion. Electronically Signed   By: Darliss Cheney M.D.   On: 12/09/2023 03:56    ECHO ordered  TELEMETRY reviewed by me 12/09/2023: NSR PVCs rate 70s  EKG reviewed by me: sinus rhythm PVCs rate 81 bpm  Data reviewed by me 12/09/2023: last 24h vitals tele labs imaging I/O ED provider note, admission H&P  Principal Problem:   Seizure  (HCC) Active Problems:   NSTEMI (non-ST elevated myocardial infarction) (HCC)   Dementia with behavioral disturbance (HCC)   Prolonged QT interval    ASSESSMENT AND PLAN:  YOVANNI FRENETTE is a 63 y.o. male  with a past medical history of hypertension and dementia who presented to the ED on 12/09/2023 for seizure. Troponin found to be elevated. Cardiology was consulted for further evaluation.   # Seizure # Demand ischemia vs NSTEMI, type II # Dementia Patient with history of dementia presenting after witnessed seizure overnight at home.  Also noted to have troponin elevation trending 213 > 638 > 951.  CK 402.  EKG demonstrated sinus rhythm with PVCs rate 81 bpm. ?Prolonged QT. -Echo ordered -Continue to trend troponin.  Suspect elevation is secondary to demand from seizure. -Repeat EKG tomorrow to evaluate QT after electrolyte correction.  -Continue heparin infusion for now. -Seizure management per neurology. -No plan for further cardiac diagnostics at this time.   This patient's plan of care was discussed and created with Dr. Darrold Junker and he is in agreement.  Signed: Gale Journey, PA-C  12/09/2023, 10:37 AM [facility] Cardiology

## 2023-12-09 NOTE — ED Notes (Signed)
Pt continues to be restless, agitated, attempting to take off monitors. Dr Alvester Morin notified

## 2023-12-10 ENCOUNTER — Inpatient Hospital Stay: Payer: No Typology Code available for payment source

## 2023-12-10 DIAGNOSIS — R569 Unspecified convulsions: Secondary | ICD-10-CM | POA: Diagnosis not present

## 2023-12-10 LAB — ECHOCARDIOGRAM COMPLETE
Area-P 1/2: 4.06 cm2
Height: 72 in
S' Lateral: 4.2 cm
Weight: 4366.87 [oz_av]

## 2023-12-10 LAB — BASIC METABOLIC PANEL
Anion gap: 8 (ref 5–15)
BUN: 10 mg/dL (ref 8–23)
CO2: 26 mmol/L (ref 22–32)
Calcium: 7.9 mg/dL — ABNORMAL LOW (ref 8.9–10.3)
Chloride: 107 mmol/L (ref 98–111)
Creatinine, Ser: 0.8 mg/dL (ref 0.61–1.24)
GFR, Estimated: >60 mL/min (ref >60)
Glucose, Bld: 85 mg/dL (ref 70–99)
Potassium: 3.5 mmol/L (ref 3.5–5.1)
Sodium: 141 mmol/L (ref 135–145)

## 2023-12-10 LAB — CBC
HCT: 43 % (ref 39.0–52.0)
Hemoglobin: 14.3 g/dL (ref 13.0–17.0)
MCH: 28.1 pg (ref 26.0–34.0)
MCHC: 33.3 g/dL (ref 30.0–36.0)
MCV: 84.5 fL (ref 80.0–100.0)
Platelets: 171 10*3/uL (ref 150–400)
RBC: 5.09 MIL/uL (ref 4.22–5.81)
RDW: 14.2 % (ref 11.5–15.5)
WBC: 12.3 10*3/uL — ABNORMAL HIGH (ref 4.0–10.5)
nRBC: 0 % (ref 0.0–0.2)

## 2023-12-10 LAB — PHOSPHORUS: Phosphorus: 2.5 mg/dL (ref 2.5–4.6)

## 2023-12-10 LAB — HEPARIN LEVEL (UNFRACTIONATED): Heparin Unfractionated: 0.5 [IU]/mL (ref 0.30–0.70)

## 2023-12-10 LAB — CK: Total CK: 1793 U/L — ABNORMAL HIGH (ref 49–397)

## 2023-12-10 MED ORDER — ENOXAPARIN SODIUM 60 MG/0.6ML IJ SOSY
60.0000 mg | PREFILLED_SYRINGE | Freq: Every evening | INTRAMUSCULAR | Status: DC
  Administered 2023-12-10: 60 mg via SUBCUTANEOUS
  Filled 2023-12-10: qty 0.6

## 2023-12-10 MED ORDER — SODIUM CHLORIDE 0.9 % IV SOLN: INTRAVENOUS | Status: AC

## 2023-12-10 MED ORDER — POTASSIUM CHLORIDE 10 MEQ/100ML IV SOLN
10.0000 meq | INTRAVENOUS | Status: AC
Start: 2023-12-10 — End: 2023-12-10
  Administered 2023-12-10 (×4): 10 meq via INTRAVENOUS
  Filled 2023-12-10 (×4): qty 100

## 2023-12-10 MED ORDER — SODIUM CHLORIDE 0.9 % IV SOLN
75.0000 mL/h | INTRAVENOUS | Status: DC
  Administered 2023-12-10: 75 mL/h via INTRAVENOUS

## 2023-12-10 MED ORDER — QUETIAPINE FUMARATE 25 MG PO TABS
50.0000 mg | ORAL_TABLET | Freq: Every evening | ORAL | Status: DC
Start: 2023-12-10 — End: 2023-12-12

## 2023-12-10 MED ORDER — LORAZEPAM 2 MG/ML IJ SOLN: 2.0000 mg | INTRAMUSCULAR | Status: DC | PRN

## 2023-12-10 MED ORDER — POTASSIUM CHLORIDE CRYS ER 20 MEQ PO TBCR: 40.0000 meq | EXTENDED_RELEASE_TABLET | Freq: Once | ORAL | Status: DC

## 2023-12-10 MED ORDER — HALOPERIDOL LACTATE 5 MG/ML IJ SOLN
2.0000 mg | Freq: Four times a day (QID) | INTRAMUSCULAR | Status: DC | PRN
  Administered 2023-12-10 – 2023-12-11 (×2): 2 mg via INTRAVENOUS
  Filled 2023-12-10 (×2): qty 1

## 2023-12-10 NOTE — Progress Notes (Signed)
PHARMACIST - PHYSICIAN COMMUNICATION  CONCERNING:  Enoxaparin (Lovenox) for DVT Prophylaxis   RECOMMENDATION: Patient was prescribed enoxaprin 40mg  q24 hours for VTE prophylaxis.   Filed Weights   12/09/23 0620  Weight: 123.8 kg (272 lb 14.9 oz)   Body mass index is 37.02 kg/m.  Based on [facility] policy patient is candidate for enoxaparin 0.5mg /kg TBW SQ every 24 hours based on BMI being >30.  DESCRIPTION: Pharmacy has adjusted enoxaparin dose per [facility] policy.  Patient is now receiving enoxaparin 60 mg every 24 hours   Littie Deeds, PharmD Pharmacy Resident  12/10/2023 4:38 PM

## 2023-12-10 NOTE — ED Notes (Signed)
Pt increasingly agitated and restless. Prn administered at wife's request

## 2023-12-10 NOTE — ED Notes (Signed)
Pts brief changed with help of Amy, RN and tech. Wife at bedside

## 2023-12-10 NOTE — ED Notes (Signed)
Brief changed with Mykia NT. Pt repositioned

## 2023-12-10 NOTE — ED Notes (Signed)
Pt calm at this time. MRI contacted

## 2023-12-10 NOTE — Progress Notes (Addendum)
[facility] Progress Note  Patient: Bryan [name]    WUJ:[id number]  DOA: 12/09/2023     Date of Service: the patient was seen and examined on 12/10/2023  Chief Complaint  Patient presents with   Seizures   Brief hospital course: PHELIX FUDALA is a 63 y.o. male with medical history significant of hypertension, dementia presenting with new onset seizure, agitation, NSTEMI, prolonged QT.  Limited history in the setting of recent seizure.  Per the wife, patient with noted episode of generalized tonic-clonic event around 3:00 this morning after using the bathroom.  Wife denies any known prior episodes like this in the past.  Wife does report patient with progressive dementia for the past 4 to 5 years.  Followed by the [facility].  In review of the chart, looks to have been seen by [facility] neurology around April 2021.  Had a EEG that was not indicative of seizure at that time.  No reported recent medication changes.  No nausea or vomiting.  Baseline hypertension.  On multiple medications.  EMS subsequently called with patient receiving 10 mg of Versed as well as 5 mg of Haldol.  Patient still combative despite medication. Presented to the ER afebrile, hemodynamically stable.  On 2 to 4 L to keep O2 sats greater than 97%.  White count 9.5, hemoglobin 15.7, platelets 165, creatinine 0.97, glucose 146, potassium 3.3, troponin 200s to 600s.  CK4 02.  EKG with transient sinus rhythm and movement artifact.  Status post IV Keppra load in the ER.  Still persistent agitation despite an additional 5 mg of Versed as well as Haldol. CT head and CXR grossly stable.   Assessment and Plan:  Seizure (HCC) Noted generalized tonic-clonic activity overnight w/ setting of baseline dementia  Persistent generalized tremor and agitation despite prn high dose versed and haldol  Protecting airway S/p keppra load in ER  EEG negative Neurology consulted, Continue Depakote 500 mg IV every 8 hourly F/u MRI brain     Dementia  with behavioral disturbance (HCC) Baseline progressive dementia w/ ? Parkinsonian features per pt's mother  Followed in the [place] system  Start Seroquel 50 mg p.o. for evening for sundowning when patient is able to swallow Use Haldol as needed   Prolonged QT interval QTc in 570s on presentation  Differential includes seizure, electrolyte abnormality, NSTEMI as potential etiologies  K 3.3-3.5 repleted, Mg level wnl 1/26 EHG shows QTc 557 1/30 EKG shows QTc 493, improved Minimize offending agents  Follow up cardiology recommendations       NSTEMI (non-ST elevated myocardial infarction), demand ischemia Trop peaked 951, trended down. No reported active CP  EKG grossly stable though w/ prolonged QT  Suspect secondary demand ischemia assd w/ seizure event  S/p heparin IV infusion given in the ED, discontinued as per cardiology.   Continue aspirin 81 mg p.o. daily  TTE LVEF 55 to 60%, grade 1 diastolic dysfunction, no wall motion abnormality. Cardiology consulted, recommended no intervention and signed off.   Rhabdomyolysis most likely due to seizures Elevated CK level 1793 Continue to trend CK Continue IV fluid for hydration   Body mass index is 37.02 kg/m.  Interventions:  Diet: NPO for now due to AMS DVT Prophylaxis: Subcutaneous Lovenox   Advance goals of care discussion: DNR-Limited  Family Communication: family was present at bedside, at the time of interview.  The pt provided permission to discuss medical plan with the family. Opportunity was given to ask question and all questions were answered satisfactorily.  Disposition:  Pt is from Home, admitted with seizures, NSTEMI, agitation and AMS, still has AMS, which precludes a safe discharge. Discharge to Home, when stable, may need few days to improve.  Subjective: No significant overnight events, patient is still altered mental status, unable to offer any complaints. Management plan discussed with patient's wife at  bedside  Physical Exam: General: NAD, altered mental status Appear in no distress,  Eyes: closed ENT: Oral Mucosa Clear, moist  Neck: no JVD,  Cardiovascular: S1 and S2 Present, no Murmur,  Respiratory: good respiratory effort, Bilateral Air entry equal and Decreased, no Crackles, no wheezes Abdomen: Bowel Sound present, Soft and no tenderness,  Skin: no rashes Extremities: no Pedal edema, no calf tenderness Neurologic: without any new focal findings, AMS, moving all extremities Gait not checked due to patient safety concerns  Vitals:   12/10/23 1015 12/10/23 1230 12/10/23 1521 12/10/23 1521  BP:  (!) 154/82  125/73  Pulse: 86 84 66 81  Resp:  (!) 21  20  Temp:  99 F (37.2 C)  98.5 F (36.9 C)  TempSrc:    Axillary  SpO2: 94% 97% 96% 96%  Weight:      Height:       No intake or output data in the 24 hours ending 12/10/23 1626 Filed Weights   12/09/23 0620  Weight: 123.8 kg    Data Reviewed: I have personally reviewed and interpreted daily labs, tele strips, imagings as discussed above. I reviewed all nursing notes, pharmacy notes, vitals, pertinent old records I have discussed plan of care as described above with RN and patient/family.  CBC: Recent Labs  Lab 12/09/23 0304 12/10/23 0606  WBC 9.5 12.3*  HGB 15.7 14.3  HCT 45.3 43.0  MCV 82.2 84.5  PLT 165 171   Basic Metabolic Panel: Recent Labs  Lab 12/09/23 0304 12/09/23 1205 12/10/23 0606  NA 140  --  141  K 3.3*  --  3.5  CL 101  --  107  CO2 24  --  26  GLUCOSE 146*  --  85  BUN 11  --  10  CREATININE 0.97  --  0.80  CALCIUM 8.6*  --  7.9*  MG  --  2.1  --   PHOS  --   --  2.5    Studies: ECHOCARDIOGRAM COMPLETE Result Date: 12/10/2023    ECHOCARDIOGRAM REPORT   Patient Name:   GARVIS DOWNUM Date of Exam: 12/09/2023 Medical Rec #:  [id number]       Height:       72.0 in Accession #:    [id number]      Weight:       272.9 lb Date of Birth:  [birth year]       BSA:          2.432 m Patient Age:     62 years        BP:           166/136 mmHg Patient Gender: M               HR:           76 bpm. Exam Location:  [facility] Procedure: 2D Echo, Cardiac Doppler and Color Doppler Indications:     I21.4 NSTEMI  History:         Patient has no prior history of Echocardiogram examinations.                  Risk Factors:Hypertension.  Sonographer:     Daphine Deutscher RDCS Referring Phys:  [id number] Dorian Pod HUDSON Diagnosing Phys: Marcina Millard MD IMPRESSIONS  1. Left ventricular ejection fraction, by estimation, is 55 to 60%. The left ventricle has normal function. The left ventricle has no regional wall motion abnormalities. Left ventricular diastolic parameters are consistent with Grade I diastolic dysfunction (impaired relaxation).  2. Right ventricular systolic function is normal. The right ventricular size is normal.  3. The mitral valve is normal in structure. Mild to moderate mitral valve regurgitation. No evidence of mitral stenosis.  4. The aortic valve is normal in structure. Aortic valve regurgitation is mild. No aortic stenosis is present.  5. The inferior vena cava is normal in size with greater than 50% respiratory variability, suggesting right atrial pressure of 3 mmHg. FINDINGS  Left Ventricle: Left ventricular ejection fraction, by estimation, is 55 to 60%. The left ventricle has normal function. The left ventricle has no regional wall motion abnormalities. The left ventricular internal cavity size was normal in size. There is  no left ventricular hypertrophy. Left ventricular diastolic parameters are consistent with Grade I diastolic dysfunction (impaired relaxation). Right Ventricle: The right ventricular size is normal. No increase in right ventricular wall thickness. Right ventricular systolic function is normal. Left Atrium: Left atrial size was normal in size. Right Atrium: Right atrial size was normal in size. Pericardium: There is no evidence of pericardial effusion. Mitral Valve: The  mitral valve is normal in structure. Mild to moderate mitral valve regurgitation. No evidence of mitral valve stenosis. Tricuspid Valve: The tricuspid valve is normal in structure. Tricuspid valve regurgitation is trivial. No evidence of tricuspid stenosis. Aortic Valve: The aortic valve is normal in structure. Aortic valve regurgitation is mild. No aortic stenosis is present. Pulmonic Valve: The pulmonic valve was normal in structure. Pulmonic valve regurgitation is not visualized. No evidence of pulmonic stenosis. Aorta: The aortic root is normal in size and structure. Venous: The inferior vena cava is normal in size with greater than 50% respiratory variability, suggesting right atrial pressure of 3 mmHg. IAS/Shunts: No atrial level shunt detected by color flow Doppler.  LEFT VENTRICLE PLAX 2D LVIDd:         5.90 cm   Diastology LVIDs:         4.20 cm   LV e' medial:    11.20 cm/s LV PW:         1.30 cm   LV E/e' medial:  5.9 LV IVS:        1.30 cm   LV e' lateral:   10.12 cm/s LVOT diam:     2.30 cm   LV E/e' lateral: 6.5 LV SV:         61 LV SV Index:   25 LVOT Area:     4.15 cm  RIGHT VENTRICLE             IVC RV Basal diam:  4.60 cm     IVC diam: 2.70 cm RV S prime:     17.70 cm/s TAPSE (M-mode): 2.6 cm LEFT ATRIUM             Index        RIGHT ATRIUM           Index LA diam:        5.10 cm 2.10 cm/m   RA Area:     15.80 cm LA Vol (A2C):   69.6 ml 28.62 ml/m  RA Volume:   41.90 ml  17.23 ml/m LA Vol (A4C):   60.2 ml 24.76 ml/m LA Biplane Vol: 69.4 ml 28.54 ml/m  AORTIC VALVE LVOT Vmax:   87.00 cm/s LVOT Vmean:  58.433 cm/s LVOT VTI:    0.146 m  AORTA Ao Root diam: 4.20 cm Ao Asc diam:  4.10 cm MITRAL VALVE MV Area (PHT): 4.06 cm    SHUNTS MV Decel Time: 187 msec    Systemic VTI:  0.15 m MV E velocity: 65.70 cm/s  Systemic Diam: 2.30 cm MV A velocity: 71.00 cm/s MV E/A ratio:  0.93 Marcina Millard MD Electronically signed by Marcina Millard MD Signature Date/Time: 12/10/2023/1:37:51 PM    Final      Scheduled Meds:  aspirin EC  81 mg Oral Daily   mouth rinse  15 mL Mouth Rinse Q2H   potassium chloride  40 mEq Oral Once   Continuous Infusions:  sodium chloride 75 mL/hr (12/10/23 0953)   valproate sodium Stopped (12/10/23 1319)   PRN Meds: acetaminophen, haloperidol lactate, hydrALAZINE, LORazepam, nitroGLYCERIN, ondansetron (ZOFRAN) IV, mouth rinse  Time spent: 55 minutes  Author: Gillis Santa. MD [facility] 12/10/2023 4:26 PM  To reach On-call, see care teams to locate the attending and reach out to them via [email or web address]. If 7PM-7AM, please contact night-coverage If you still have difficulty reaching the attending provider, please page the [facility] (Director on Call) for [facility] on amion for assistance.

## 2023-12-10 NOTE — ED Notes (Signed)
Pt cleaned up and diaper changed.  Meds infusing  family with pt

## 2023-12-10 NOTE — TOC Progression Note (Signed)
Transition of Care [facility]) - Progression Note    Patient Details  Name: Bryan [name] MRN: [id number] Date of Birth: Feb 01, 1961  Transition of Care [facility]) CM/SW Contact  Tory Emerald, [place] Phone Number: 12/10/2023, 10:52 AM  Clinical Narrative:     CSW noted [facility] consult; however, CSW unable to complete consult until PT/OT complete assessment and make recommendations. CSW asked attending to re-consult TOC once PT/OT assess and make recs. CSW closing consult.        Expected Discharge Plan and Services                                               Social Determinants of Health (SDOH) Interventions SDOH Screenings   Tobacco Use: Medium Risk (12/09/2023)    Readmission Risk Interventions     No data to display

## 2023-12-10 NOTE — ED Notes (Signed)
Brief changed with Bryan [name]

## 2023-12-10 NOTE — Progress Notes (Signed)
PHARMACY - ANTICOAGULATION CONSULT NOTE  Pharmacy Consult for Heparin  Indication: chest pain/ACS  No Known Allergies  Patient Measurements: Height: 6' (182.9 cm) Weight: 123.8 kg (272 lb 14.9 oz) IBW/kg (Calculated) : 77.6 Heparin Dosing Weight: 105 kg   Vital Signs: Temp: 98.7 F (37.1 C) (01/30 0633) Temp Source: Axillary (01/30 0130) BP: 151/79 (01/30 1610) Pulse Rate: 69 (01/30 0632)  Labs: Recent Labs    12/09/23 0304 12/09/23 0451 12/09/23 0902 12/09/23 1205 12/09/23 1228 12/09/23 1827 12/10/23 0606  HGB 15.7  --   --   --   --   --  14.3  HCT 45.3  --   --   --   --   --  43.0  PLT 165  --   --   --   --   --  171  HEPARINUNFRC  --   --   --   --  0.39 0.34 0.50  CREATININE 0.97  --   --   --   --   --   --   CKTOTAL  --  402*  --   --   --   --   --   TROPONINIHS 213* 638* 951* 871*  --  874*  --     Estimated Creatinine Clearance: 104.6 mL/min (by C-G formula based on SCr of 0.97 mg/dL).   Medical History: Past Medical History:  Diagnosis Date   Hypertension     Medications:  (Not in a hospital admission)   Assessment: 63 y.o. male with PMH including HTN is presenting with new onset seizure and ACS. QT prolonged at 557. Patient is not on chronic anticoagulation per chart review. Pharmacy has been consulted to initiate and manage heparin intravenous infusion.  Goal of Therapy:  Heparin level 0.3-0.7 units/ml Monitor platelets by anticoagulation protocol: Yes  Date Time aPTT/HL Rate/Comment 1/29 1228 HL 0.39 Therapeutic x 1  1/29 1827 HL 0.34 Therapeutic x 2 1/30      0606   HL 0.50            Therapeutic x 3      Plan:  Continue heparin infusion at 1450 units/hr recheck HL on 1/31 with AM labs Continue to monitor H&H and platelets daily  Malynn Lucy D Clinical Pharmacist 12/10/2023 6:45 AM

## 2023-12-10 NOTE — ED Notes (Signed)
Dr Lucianne Muss notified of pt still unable to be still or follow directions for MRI.

## 2023-12-10 NOTE — Progress Notes (Signed)
PT Cancellation Note  Patient Details Name: Bryan [name] MRN: [id number] DOB: Mar 30, 1961   Cancelled Treatment:    Reason Eval/Treat Not Completed: Patient not medically ready.  PT consult received.  Nurse reports pt has been restless and agitated this afternoon and not really able to follow any cues.  Per discussion with pt's nurse, will hold therapy at this time and re-attempt PT evaluation at a later date/time.  Hendricks Limes, PT 12/10/23, 3:12 PM

## 2023-12-10 NOTE — ED Notes (Signed)
Resumed care from bri rn.  Pt sleeping.  Pt on 2 liters oxygen n/c.  Wife at bedside.  Heparin discontinued.  Iv fluids infusing.   Seizure pads in place.  Skin warm and dry.  Siderails up x 2.

## 2023-12-10 NOTE — ED Notes (Signed)
Brief changed with Connye Burkitt RN

## 2023-12-10 NOTE — ED Notes (Signed)
Pt's diaper wet, pt cleaned and diaper changed   family at bedside with pt  meds infusing.

## 2023-12-10 NOTE — ED Notes (Signed)
Attempted to take pt to MRI at this time. Pt increasingly restless and agitated. Discussed situation with MRI techs at bedside, decided would need to try at another time when pt calm

## 2023-12-10 NOTE — ED Notes (Signed)
Wife called RN into room. Requesting additional prn meds for agitation, restless. Dr Lucianne Muss messaged d/t ativan order modified for prn seizures.

## 2023-12-10 NOTE — Progress Notes (Signed)
SLP Cancellation Note  Patient Details Name: Bryan [name] MRN: [id number] DOB: 09-12-1961   Cancelled treatment:       Reason Eval/Treat Not Completed: Patient not medically ready;Medical issues which prohibited therapy (chart reviewed)   Per chart notes today, pt has been restless and agitated, not following commands consistently. Earlier NSG "requesting additional prn meds for agitation, restless". Pt unable to complete MRI this afternoon. Pt does not appear appropriate for CSE at this time; increased risk for aspiration present.  ST services will f/u tomorrow w/ assessment as appropriate. Recommend oral care for hygiene and stimulation of swallowing.       Jerilynn Som, MS, CCC-SLP Speech Language Pathologist Rehab Services; [facility] [phone] (ascom) Sahid Borba 12/10/2023, 2:55 PM

## 2023-12-10 NOTE — ED Notes (Signed)
Pt noticeably restless, agitated. Family at bedside.

## 2023-12-10 NOTE — ED Notes (Signed)
Pt brief changed from BM

## 2023-12-10 NOTE — Progress Notes (Addendum)
OT Cancellation Note  Patient Details Name: CASHTON HOSLEY MRN: [id number] DOB: Apr 01, 1961   Cancelled Treatment:    Reason Eval/Treat Not Completed: Other (comment). Orders received, chart reviewed. Discussed with RN - pt currently restless with agitation, not consistently following commands. OT will check back and eval as appropriate,.   Shamina Etheridge L. Bettey Muraoka, OTR/L  12/10/23, 11:57 AM

## 2023-12-10 NOTE — ED Notes (Signed)
Wife informed this RN that she took bp cuff off, states pt becomes increasingly agitated when taking bp. Informed wife will check bp periodically.

## 2023-12-10 NOTE — ED Notes (Signed)
Pt continues to become increasingly agitated and restless with any interaction

## 2023-12-10 NOTE — Progress Notes (Addendum)
[facility] CLINIC CARDIOLOGY PROGRESS NOTE       Patient ID: Bryan [name] MRN: [id number] DOB/AGE: 1961-09-19 63 y.o.  Admit date: 12/09/2023 Referring Physician Dr. Alvester Morin Primary Physician Administration, [facility] Cardiologist None Reason for Consultation elevated troponin  HPI: Bryan [name] is a 63 y.o. male  with a past medical history of hypertension and dementia who presented to the ED on 12/09/2023 for seizure. Troponin found to be elevated. Cardiology was consulted for further evaluation.   Interval history: -Patient seen and examined this AM, daughter at bedside.  -Reports he did get some rest overnight, still with agitation at times. Still somnolent and does not answer questions or follow commands.  -BP and HR remain stable. Troponins downtrending.   Review of systems complete and found to be negative unless listed above    Past Medical History:  Diagnosis Date   Hypertension     Past Surgical History:  Procedure Laterality Date   BACK SURGERY     BACK SURGERY  ankle acl right   HERNIA REPAIR      (Not in a hospital admission)  Social History   Socioeconomic History   Marital status: Married    Spouse name: Not on file   Number of children: Not on file   Years of education: Not on file   Highest education level: Not on file  Occupational History   Not on file  Tobacco Use   Smoking status: Former    Current packs/day: 0.00    Types: Cigarettes    Quit date: 03/25/1979    Years since quitting: 44.7   Smokeless tobacco: Not on file  Substance and Sexual Activity   Alcohol use: Yes   Drug use: No   Sexual activity: Yes  Other Topics Concern   Not on file  Social History Narrative   Not on file   Social Drivers of Health   Financial Resource Strain: Not on file  Food Insecurity: Not on file  Transportation Needs: Not on file  Physical Activity: Not on file  Stress: Not on file  Social Connections: Not on file  Intimate Partner  Violence: Not on file    Family History  Problem Relation Age of Onset   Heart failure Father    Cancer Sister      Vitals:   12/10/23 0600 12/10/23 0632 12/10/23 0633 12/10/23 1015  BP:  (!) 151/79    Pulse: 94 69  86  Resp:  20    Temp:   98.7 F (37.1 C)   TempSrc:      SpO2: 96% 98%  94%  Weight:      Height:        PHYSICAL EXAM General: Ill-appearing male, well nourished, in no acute distress. HEENT: Normocephalic and atraumatic. Neck: No JVD.  Lungs: Normal respiratory effort on 4L [place]. Clear bilaterally to auscultation. No wheezes, crackles, rhonchi.  Heart: HRRR. Normal S1 and S2 without gallops or murmurs.  Abdomen: Non-distended appearing.  Msk: Normal strength and tone for age. Extremities: Warm and well perfused. No clubbing, cyanosis.  No edema.  Neuro: Somnolent. Psych: Does not answer questions.   Labs: Basic Metabolic Panel: Recent Labs    12/09/23 0304 12/09/23 1205 12/10/23 0606  NA 140  --  141  K 3.3*  --  3.5  CL 101  --  107  CO2 24  --  26  GLUCOSE 146*  --  85  BUN 11  --  10  CREATININE 0.97  --  0.80  CALCIUM 8.6*  --  7.9*  MG  --  2.1  --   PHOS  --   --  2.5   Liver Function Tests: Recent Labs    12/09/23 0304  AST 29  ALT 21  ALKPHOS 55  BILITOT 0.7  PROT 6.9  ALBUMIN 3.7   No results for input(s): "LIPASE", "AMYLASE" in the last 72 hours. CBC: Recent Labs    12/09/23 0304 12/10/23 0606  WBC 9.5 12.3*  HGB 15.7 14.3  HCT 45.3 43.0  MCV 82.2 84.5  PLT 165 171   Cardiac Enzymes: Recent Labs    12/09/23 0451 12/09/23 0902 12/09/23 1205 12/09/23 1827 12/10/23 0606  CKTOTAL 402*  --   --   --  1,793*  TROPONINIHS 638* 951* 871* 874*  --    BNP: No results for input(s): "BNP" in the last 72 hours. D-Dimer: No results for input(s): "DDIMER" in the last 72 hours. Hemoglobin A1C: No results for input(s): "HGBA1C" in the last 72 hours. Fasting Lipid Panel: No results for input(s): "CHOL", "HDL", "LDLCALC",  "TRIG", "CHOLHDL", "LDLDIRECT" in the last 72 hours. Thyroid Function Tests: No results for input(s): "TSH", "T4TOTAL", "T3FREE", "THYROIDAB" in the last 72 hours.  Invalid input(s): "FREET3" Anemia Panel: No results for input(s): "VITAMINB12", "FOLATE", "FERRITIN", "TIBC", "IRON", "RETICCTPCT" in the last 72 hours.   Radiology: EEG adult Result Date: 12/09/2023 Rejeana Brock, MD     12/09/2023  2:04 PM History: 63 year old male with new onset seizure in the setting of dementia Sedation: Ativan and Haldol Patient State: Awake and asleep Technique: This EEG was acquired with electrodes placed according to the International 10-20 electrode system (including Fp1, Fp2, F3, F4, C3, C4, P3, P4, O1, O2, T3, T4, T5, T6, A1, A2, Fz, Cz, Pz). The following electrodes were missing or displaced: none. Background: The background consists predominantly of generalized irregular slow activities with occasional periods where a posterior dominant rhythm that is poorly formed of 7 to 8 Hz is seen.  Sleep spindles are seen at times and are symmetric. Photic stimulation: Physiologic driving is not performed EEG Abnormalities: 1) generalized irregular slow activity 2) slow posterior dominant rhythm Clinical Interpretation: This EEG is consistent with a generalized nonspecific cerebral dysfunction (encephalopathy). There was no seizure or seizure predisposition recorded on this study. Please note that lack of epileptiform activity on EEG does not preclude the possibility of epilepsy. Ritta Slot, MD [facility] [phone] If 7pm- 7am, please page neurology on call as listed in AMION.  CT Head Wo Contrast Result Date: 12/09/2023 CLINICAL DATA:  Mental status change EXAM: CT HEAD WITHOUT CONTRAST TECHNIQUE: Contiguous axial images were obtained from the base of the skull through the vertex without intravenous contrast. RADIATION DOSE REDUCTION: This exam was performed according to the departmental  dose-optimization program which includes automated exposure control, adjustment of the mA and/or kV according to patient size and/or use of iterative reconstruction technique. COMPARISON:  MRI brain 12/28/2019 FINDINGS: Brain: No evidence of acute infarction, hemorrhage, hydrocephalus, extra-axial collection or mass lesion/mass effect. Again seen is mild diffuse atrophy and mild periventricular white matter hypodensity, likely chronic small vessel ischemic change. Vascular: No hyperdense vessel or unexpected calcification. Skull: Normal. Negative for fracture or focal lesion. Sinuses/Orbits: No acute finding. Other: None. IMPRESSION: 1. No acute intracranial process. 2. Mild diffuse atrophy and mild chronic small vessel ischemic change. Electronically Signed   By: Darliss Cheney M.D.   On: 12/09/2023 04:01  DG Chest Port 1 View Result Date: 12/09/2023 CLINICAL DATA:  Seizure EXAM: PORTABLE CHEST 1 VIEW COMPARISON:  Chest x-ray 11/27/2008 FINDINGS: The cardiomediastinal silhouette appears enlarged, a new finding. There central pulmonary vascular congestion. Lung volumes are low. There is no focal lung infiltrate, pleural effusion or pneumothorax. No acute fractures are seen. IMPRESSION: 1. New enlarged cardiomediastinal silhouette. Findings may be related to patient positioning. Consider follow-up PA and lateral chest x-ray when patient's condition permits. 2. Central pulmonary vascular congestion. Electronically Signed   By: Darliss Cheney M.D.   On: 12/09/2023 03:56    ECHO pending  TELEMETRY reviewed by me 12/10/2023: NSR rate 60s  EKG reviewed by me: sinus rhythm PVCs rate 81 bpm  Data reviewed by me 12/10/2023: last 24h vitals tele labs imaging I/O hospitalist progress note  Principal Problem:   Seizure (HCC) Active Problems:   NSTEMI (non-ST elevated myocardial infarction) (HCC)   Dementia with behavioral disturbance (HCC)   Prolonged QT interval    ASSESSMENT AND PLAN:  Bryan [name] is a  63 y.o. male  with a past medical history of hypertension and dementia who presented to the ED on 12/09/2023 for seizure. Troponin found to be elevated. Cardiology was consulted for further evaluation.   # Seizure # Demand ischemia  # Dementia Patient with history of dementia presenting after witnessed seizure overnight at home.  Also noted to have troponin elevation trending 213 > 638 > 951 > 874.  CK 402.  EKG demonstrated sinus rhythm with PVCs rate 81 bpm. QT does not appear to be prolonged on EKG this AM.  -Echo pending -Continue heparin infusion for now. Likely DC pending results of echo.  -Seizure management per neurology. -No plan for further cardiac diagnostics at this time.  ADDENDUM: Echo with normal EF, no WMAs. No further workup recommended. Will DC heparin. Cardiology will sign off. Please haiku with questions or re-engage if needed.    This patient's plan of care was discussed and created with Dr. Darrold Junker and he is in agreement.  Signed: Gale Journey, PA-C  12/10/2023, 10:55 AM [facility] Cardiology

## 2023-12-11 ENCOUNTER — Other Ambulatory Visit: Payer: Self-pay

## 2023-12-11 DIAGNOSIS — R569 Unspecified convulsions: Secondary | ICD-10-CM | POA: Diagnosis not present

## 2023-12-11 LAB — CBC
HCT: 41.8 % (ref 39.0–52.0)
Hemoglobin: 13.9 g/dL (ref 13.0–17.0)
MCH: 28.2 pg (ref 26.0–34.0)
MCHC: 33.3 g/dL (ref 30.0–36.0)
MCV: 84.8 fL (ref 80.0–100.0)
Platelets: 163 10*3/uL (ref 150–400)
RBC: 4.93 MIL/uL (ref 4.22–5.81)
RDW: 14.4 % (ref 11.5–15.5)
WBC: 10 10*3/uL (ref 4.0–10.5)
nRBC: 0 % (ref 0.0–0.2)

## 2023-12-11 LAB — BASIC METABOLIC PANEL
Anion gap: 11 (ref 5–15)
BUN: 16 mg/dL (ref 8–23)
CO2: 25 mmol/L (ref 22–32)
Calcium: 7.8 mg/dL — ABNORMAL LOW (ref 8.9–10.3)
Chloride: 107 mmol/L (ref 98–111)
Creatinine, Ser: 0.64 mg/dL (ref 0.61–1.24)
GFR, Estimated: >60 mL/min (ref >60)
Glucose, Bld: 86 mg/dL (ref 70–99)
Potassium: 3.7 mmol/L (ref 3.5–5.1)
Sodium: 143 mmol/L (ref 135–145)

## 2023-12-11 LAB — CK: Total CK: 751 U/L — ABNORMAL HIGH (ref 49–397)

## 2023-12-11 LAB — VALPROIC ACID LEVEL: Valproic Acid Lvl: 45 ug/mL — ABNORMAL LOW (ref 50.0–100.0)

## 2023-12-11 LAB — PHOSPHORUS: Phosphorus: 2.1 mg/dL — ABNORMAL LOW (ref 2.5–4.6)

## 2023-12-11 LAB — LIPOPROTEIN A (LPA): Lipoprotein (a): <8.4 nmol/L (ref <75.0)

## 2023-12-11 LAB — MAGNESIUM: Magnesium: 2.1 mg/dL (ref 1.7–2.4)

## 2023-12-11 MED ORDER — QUETIAPINE FUMARATE 50 MG PO TABS
50.0000 mg | ORAL_TABLET | Freq: Every day | ORAL | 0 refills | Status: AC | PRN
  Filled 2023-12-11: qty 30, 30d supply, fill #0

## 2023-12-11 MED ORDER — DIVALPROEX SODIUM 500 MG PO DR TAB
500.0000 mg | DELAYED_RELEASE_TABLET | Freq: Three times a day (TID) | ORAL | 11 refills | Status: AC
  Filled 2023-12-11: qty 90, 30d supply, fill #0
  Filled 2024-01-20: qty 90, 30d supply, fill #1
  Filled 2024-03-01: qty 90, 30d supply, fill #2
  Filled 2024-04-22: qty 90, 30d supply, fill #3
  Filled 2024-05-23: qty 90, 30d supply, fill #4
  Filled 2024-07-18: qty 90, 30d supply, fill #5

## 2023-12-11 MED ORDER — FAMOTIDINE 20 MG PO TABS
20.0000 mg | ORAL_TABLET | Freq: Two times a day (BID) | ORAL | 11 refills | Status: AC
  Filled 2023-12-11: qty 60, 30d supply, fill #0

## 2023-12-11 MED ORDER — POTASSIUM PHOSPHATES 15 MMOLE/5ML IV SOLN
30.0000 mmol | Freq: Once | INTRAVENOUS | Status: AC
  Administered 2023-12-11: 30 mmol via INTRAVENOUS
  Filled 2023-12-11: qty 10

## 2023-12-11 MED ORDER — ASPIRIN 81 MG PO TBEC
81.0000 mg | DELAYED_RELEASE_TABLET | Freq: Every day | ORAL | 12 refills | Status: AC
  Filled 2023-12-11: qty 30, 30d supply, fill #0

## 2023-12-11 MED ORDER — ACETAMINOPHEN 325 MG PO TABS: 650.0000 mg | ORAL_TABLET | Freq: Four times a day (QID) | ORAL | Status: AC | PRN

## 2023-12-11 MED ORDER — QUETIAPINE FUMARATE 50 MG PO TABS
50.0000 mg | ORAL_TABLET | Freq: Every evening | ORAL | 5 refills | Status: AC
  Filled 2023-12-11: qty 30, 30d supply, fill #0

## 2023-12-11 NOTE — ED Notes (Signed)
Patient up for discharge. This RN spoke with wife regarding safe discharge due to wife stating that she does not have resources from the [place] at home yet. This RN spoke with wife for approx 15 minutes for same. Pt wife is primary care giver and states she believes she can care for him with assistance of family members across the street. This RN concerned that pt's wife will need more help at home due to pt now being bed bound and was previously ambulatory. Wife repeatedly stated to this RN that she is okay to take patient home today.   Spoke with MD regarding same. Plan is to send patient home after potassium phosphate infusion finishes later this afternoon. Wife updated on plan and agreeable to discharge home with EMS upon finishing infusion.

## 2023-12-11 NOTE — Progress Notes (Signed)
PT Cancellation Note  Patient Details Name: Bryan [name] MRN: [id number] DOB: September 26, 1961   Cancelled Treatment:    Reason Eval/Treat Not Completed: Other (comment).  PT consult received.  Chart reviewed.  Pt sleeping in ED stretcher bed upon PT arrival; pt's wife present.  Pt's wife reports plan to bring pt home (hopefully today) and plans for pt to be bed level.  Pt's wife requesting to hold therapy at this time; OT updated.  Will monitor pt's status.  Hendricks Limes, PT 12/11/23, 11:23 AM

## 2023-12-11 NOTE — ED Notes (Signed)
Patient's brief noted to be wet. Patient cleaned and new brief placed. Pt repositioned in bed. Family at bedside. Daughter stating that they no longer want MRI and would like to go home once resources are available at home. Pt's wife at home trying to contact [place] for resources.

## 2023-12-11 NOTE — ED Notes (Signed)
Pt resting w/ eyes closed, equal rise and fall of chest noted. Family at bedside.

## 2023-12-11 NOTE — Discharge Summary (Signed)
[facility] Discharge Summary   Patient: Bryan [name] ZOX:[id number]  PCP: [facility]  Date of admission: 12/09/2023   Date of discharge:  12/11/2023     Discharge Diagnoses:  Principal Problem:   Seizure [facility]) Active Problems:   NSTEMI (non-ST elevated myocardial infarction) (HCC)   Dementia with behavioral disturbance (HCC)   Prolonged QT interval   Admitted From: Home Disposition:  [facility]  Recommendations for Outpatient Follow-up:  Follow-up with PCP in 1 week, continue monitor BP at home and follow with PCP to titrate medication accordingly.  Blood pressure was soft so discontinued verapamil, and hydrochlorothiazide.  Continued telmisartan with holding parameters to hold if SBP less than 140 mmHg. Follow-up with neurology in 1 to 2 weeks, started Depakote for seizure Due to sundowning started Seroquel 50 mg nightly and 50 mg in a.m. as needed Recommend to follow with [facility] psych as well in 1 to 2 weeks for further management Follow-up with cardiology in 1 to 2 weeks Follow up LABS/TEST:  As above   Follow-up Information     Custovic, Sabina, DO. Go in 1 week(s).   Specialty: Cardiology Contact information: [address] [place] [address] [phone]         [facility] Follow up in 1 week(s).   Contact information: [address] [place] [address] [phone]                Diet recommendation: Dysphagia type 1 Nector thick Liquid  Activity: The patient is advised to gradually reintroduce usual activities, as tolerated  Discharge Condition: stable  Code Status: Full code   History of present illness: As per the H and P dictated on admission Hospital Course:  Bryan [name] is a 63 y.o. male with medical history significant of hypertension, dementia presenting with new onset seizure, agitation, NSTEMI, prolonged QT.  Limited history in the setting of recent seizure.  Per  the wife, patient with noted episode of generalized tonic-clonic event around 3:00 this morning after using the bathroom.  Wife denies any known prior episodes like this in the past.  Wife does report patient with progressive dementia for the past 4 to 5 years.  Followed by the [facility].  In review of the chart, looks to have been seen by [facility] neurology around April 2021.  Had a EEG that was not indicative of seizure at that time.  No reported recent medication changes.  No nausea or vomiting.  Baseline hypertension.  On multiple medications.  EMS subsequently called with patient receiving 10 mg of Versed as well as 5 mg of Haldol.  Patient still combative despite medication. Presented to the ER afebrile, hemodynamically stable.  On 2 to 4 L to keep O2 sats greater than 97%.  White count 9.5, hemoglobin 15.7, platelets 165, creatinine 0.97, glucose 146, potassium 3.3, troponin 200s to 600s.  CK4 02.  EKG with transient sinus rhythm and movement artifact.  Status post IV Keppra load in the ER.  Still persistent agitation despite an additional 5 mg of Versed as well as Haldol. CT head and CXR grossly stable.     Assessment and Plan:   # Seizure, new onset, noted generalized tonic-clonic activity overnight w/ setting of baseline dementia  Persistent generalized tremor and agitation despite prn high dose versed and haldol  Protecting airway. S/p keppra load in ER. EEG negative. Neurology consulted, s/p Depakote 500 mg IV every 8 hourly. MRI brain could not be done  due to altered mental status and family refused to get it done as patient will not be able to tolerate.  Patient was discharged on Depakote 40 mg p.o. 3 times daily.      # Dementia with behavioral disturbance Baseline progressive dementia w/ ? Parkinsonian features per pt's mother  Followed in the [place] system . Start Seroquel 50 mg p.o. for evening for sundowning and Seroquel 50 mg p.o. daily in the morning current for agitation, delirium or confusion.   Recommended to follow with psychiatrist and neurologist as an outpatient. # Prolonged QT interval: QTc in 570s on presentation  Differential includes seizure, electrolyte abnormality, NSTEMI as potential etiologies  K 3.3-3.5 repleted, Mg level wnl. On 1/26 EHG shows QTc 557 and On 1/30 EKG shows QTc 493, improved. Follow-up with cardiology as an outpatient for further management # NSTEMI (non-ST elevated myocardial infarction), demand ischemia Trop peaked 951, trended down. No reported active CP  EKG grossly stable though w/ prolonged QT  Suspect secondary demand ischemia assd w/ seizure event  S/p heparin IV infusion given in the ED, discontinued as per cardiology.   Continue aspirin 81 mg p.o. daily  TTE LVEF 55 to 60%, grade 1 diastolic dysfunction, no wall motion abnormality. Cardiology consulted, recommended no intervention and signed off.   # Rhabdomyolysis most likely due to seizures Elevated CK level 1793--751, trending down. S/p IV fluid given for for hydration Patient remains at high risk for dehydration because of swallowing issue, patient was recommended pured diet with nectar thick liquids by SLP. Follow-up with PCP for further management as an outpatient    Body mass index is 37.02 kg/m.  Nutrition Interventions:  PT and OT was consulted but patient was altered mental status so they could not be evaluated. SLP eval done, recommended dysphagia 1 diet with nectar thick liquids.  On the day of the discharge the patient's vitals were stable, but patient remained altered mental status.  Clinically patient was not stable but patient's wife requested for the discharge as she has support at home and wanted him to take care of at home as he was very agitated here in the hospital due to beeping noise of monitors and bright light in the room and stayed in the ED without any window in the room.  The patient was and no other acute medical condition were reported by patient. the patient  was felt safe to be discharge at Home with Home health.  Consultants: Neurology and cardiology Procedures: None  Discharge Exam: General: Appear in no distress, Oral Mucosa Clear, moist. Cardiovascular: S1 and S2 Present, no Murmur, Respiratory: normal respiratory effort, Bilateral Air entry present and no Crackles, no wheezes Abdomen: Bowel Sound present, Soft and no tenderness, no hernia Extremities: no Pedal edema, no calf tenderness Neurology: AMS, AAOx zero, moving all extremities, no focal deficit, wearing mittens before discharge   Filed Weights   12/09/23 0620  Weight: 123.8 kg   Vitals:   12/11/23 0856 12/11/23 0857  BP:  132/76  Pulse:  63  Resp:  20  Temp: 98.8 F (37.1 C)   SpO2:  98%    DISCHARGE MEDICATION: Allergies as of 12/11/2023   No Known Allergies      Medication List     STOP taking these medications    amoxicillin-clavulanate 875-125 MG tablet Commonly known as: AUGMENTIN   chlorpheniramine-HYDROcodone 10-8 MG/5ML Suer Commonly known as: Tussionex Pennkinetic ER   furosemide 20 MG tablet Commonly known as: LASIX   hydrochlorothiazide  25 MG tablet Commonly known as: HYDRODIURIL   HYDROcodone-acetaminophen 5-325 MG tablet Commonly known as: NORCO/VICODIN   ipratropium 0.06 % nasal spray Commonly known as: Atrovent   predniSONE 10 MG tablet Commonly known as: DELTASONE   temazepam 30 MG capsule Commonly known as: RESTORIL   verapamil 240 MG CR tablet Commonly known as: CALAN-SR       TAKE these medications    acetaminophen 325 MG tablet Commonly known as: TYLENOL Take 2 tablets (650 mg total) by mouth every 6 (six) hours as needed for headache, mild pain (pain score 1-3), fever or moderate pain (pain score 4-6).   aspirin EC 81 MG tablet Take 1 tablet (81 mg total) by mouth daily. Swallow whole. Start taking on: December 12, 2023   atorvastatin 10 MG tablet Commonly known as: LIPITOR Take 10 mg by mouth at bedtime.    celecoxib 200 MG capsule Commonly known as: CELEBREX Take 200 mg by mouth 2 (two) times daily.   divalproex 500 MG DR tablet Commonly known as: Depakote Take 1 tablet (500 mg total) by mouth 3 (three) times daily.   famotidine 20 MG tablet Commonly known as: PEPCID Take 1 tablet (20 mg total) by mouth 2 (two) times daily.   Melatonin 5 MG Chew Chew 5 mg by mouth at bedtime as needed. What changed:  how much to take when to take this reasons to take this   mirtazapine 15 MG tablet Commonly known as: REMERON Take 15 mg by mouth at bedtime.   QUEtiapine 50 MG tablet Commonly known as: SEROQUEL Take 1 tablet (50 mg total) by mouth every evening.   QUEtiapine 50 MG tablet Commonly known as: SEROquel Take 1 tablet (50 mg total) by mouth daily as needed (for Agitation, delirium and confusion).   telmisartan 40 MG tablet Commonly known as: MICARDIS Take 1 tablet (40 mg total) by mouth at bedtime. Hold if SBP <140 What changed: additional instructions   traZODone 50 MG tablet Commonly known as: DESYREL Take 50 mg by mouth at bedtime.       No Known Allergies Discharge Instructions     Ambulatory referral to Neurology   Complete by: As directed    An appointment is requested in approximately: 1 week   Call MD for:   Complete by: As directed    Intractable seizures, any other neurological changes.   Call MD for:  difficulty breathing, headache or visual disturbances   Complete by: As directed    Call MD for:  extreme fatigue   Complete by: As directed    Call MD for:  persistant dizziness or light-headedness   Complete by: As directed    Call MD for:  persistant nausea and vomiting   Complete by: As directed    Call MD for:  severe uncontrolled pain   Complete by: As directed    Call MD for:  temperature >100.4   Complete by: As directed    Diet - low sodium heart healthy   Complete by: As directed    Discharge instructions   Complete by: As directed     Follow-up with PCP in 1 week, continue monitor BP at home and follow with PCP to titrate medication accordingly.  Blood pressure was soft so discontinued verapamil, and hydrochlorothiazide.  Continued telmisartan with holding parameters to hold if SBP less than 140 mmHg. Follow-up with neurology in 1 to 2 weeks, started Depakote for seizure Due to sundowning started Seroquel 50 mg nightly and 50  mg in a.m. as needed Recommend to follow with [facility] psych as well in 1 to 2 weeks for further management Follow-up with cardiology in 1 to 2 weeks   Increase activity slowly   Complete by: As directed        The results of significant diagnostics from this hospitalization (including imaging, microbiology, ancillary and laboratory) are listed below for reference.    Significant Diagnostic Studies: ECHOCARDIOGRAM COMPLETE Result Date: 12/10/2023    ECHOCARDIOGRAM REPORT   Patient Name:   HEARL HEIKES Date of Exam: 12/09/2023 Medical Rec #:  [id number]       Height:       72.0 in Accession #:    [id number]      Weight:       272.9 lb Date of Birth:  December 18, 1960       BSA:          2.432 m Patient Age:    62 years        BP:           166/136 mmHg Patient Gender: M               HR:           76 bpm. Exam Location:  [facility] Procedure: 2D Echo, Cardiac Doppler and Color Doppler Indications:     I21.4 NSTEMI  History:         Patient has no prior history of Echocardiogram examinations.                  Risk Factors:Hypertension.  Sonographer:     Daphine Deutscher RDCS Referring Phys:  [id number] Dorian Pod HUDSON Diagnosing Phys: Marcina Millard MD IMPRESSIONS  1. Left ventricular ejection fraction, by estimation, is 55 to 60%. The left ventricle has normal function. The left ventricle has no regional wall motion abnormalities. Left ventricular diastolic parameters are consistent with Grade I diastolic dysfunction (impaired relaxation).  2. Right ventricular systolic function is normal. The right ventricular size is  normal.  3. The mitral valve is normal in structure. Mild to moderate mitral valve regurgitation. No evidence of mitral stenosis.  4. The aortic valve is normal in structure. Aortic valve regurgitation is mild. No aortic stenosis is present.  5. The inferior vena cava is normal in size with greater than 50% respiratory variability, suggesting right atrial pressure of 3 mmHg. FINDINGS  Left Ventricle: Left ventricular ejection fraction, by estimation, is 55 to 60%. The left ventricle has normal function. The left ventricle has no regional wall motion abnormalities. The left ventricular internal cavity size was normal in size. There is  no left ventricular hypertrophy. Left ventricular diastolic parameters are consistent with Grade I diastolic dysfunction (impaired relaxation). Right Ventricle: The right ventricular size is normal. No increase in right ventricular wall thickness. Right ventricular systolic function is normal. Left Atrium: Left atrial size was normal in size. Right Atrium: Right atrial size was normal in size. Pericardium: There is no evidence of pericardial effusion. Mitral Valve: The mitral valve is normal in structure. Mild to moderate mitral valve regurgitation. No evidence of mitral valve stenosis. Tricuspid Valve: The tricuspid valve is normal in structure. Tricuspid valve regurgitation is trivial. No evidence of tricuspid stenosis. Aortic Valve: The aortic valve is normal in structure. Aortic valve regurgitation is mild. No aortic stenosis is present. Pulmonic Valve: The pulmonic valve was normal in structure. Pulmonic valve regurgitation is not visualized. No evidence of pulmonic stenosis. Aorta: The aortic root is normal  in size and structure. Venous: The inferior vena cava is normal in size with greater than 50% respiratory variability, suggesting right atrial pressure of 3 mmHg. IAS/Shunts: No atrial level shunt detected by color flow Doppler.  LEFT VENTRICLE PLAX 2D LVIDd:         5.90 cm    Diastology LVIDs:         4.20 cm   LV e' medial:    11.20 cm/s LV PW:         1.30 cm   LV E/e' medial:  5.9 LV IVS:        1.30 cm   LV e' lateral:   10.12 cm/s LVOT diam:     2.30 cm   LV E/e' lateral: 6.5 LV SV:         61 LV SV Index:   25 LVOT Area:     4.15 cm  RIGHT VENTRICLE             IVC RV Basal diam:  4.60 cm     IVC diam: 2.70 cm RV S prime:     17.70 cm/s TAPSE (M-mode): 2.6 cm LEFT ATRIUM             Index        RIGHT ATRIUM           Index LA diam:        5.10 cm 2.10 cm/m   RA Area:     15.80 cm LA Vol (A2C):   69.6 ml 28.62 ml/m  RA Volume:   41.90 ml  17.23 ml/m LA Vol (A4C):   60.2 ml 24.76 ml/m LA Biplane Vol: 69.4 ml 28.54 ml/m  AORTIC VALVE LVOT Vmax:   87.00 cm/s LVOT Vmean:  58.433 cm/s LVOT VTI:    0.146 m  AORTA Ao Root diam: 4.20 cm Ao Asc diam:  4.10 cm MITRAL VALVE MV Area (PHT): 4.06 cm    SHUNTS MV Decel Time: 187 msec    Systemic VTI:  0.15 m MV E velocity: 65.70 cm/s  Systemic Diam: 2.30 cm MV A velocity: 71.00 cm/s MV E/A ratio:  0.93 Marcina Millard MD Electronically signed by Marcina Millard MD Signature Date/Time: 12/10/2023/1:37:51 PM    Final    EEG adult Result Date: 12/09/2023 Rejeana Brock, MD     12/09/2023  2:04 PM History: 63 year old male with new onset seizure in the setting of dementia Sedation: Ativan and Haldol Patient State: Awake and asleep Technique: This EEG was acquired with electrodes placed according to the International 10-20 electrode system (including Fp1, Fp2, F3, F4, C3, C4, P3, P4, O1, O2, T3, T4, T5, T6, A1, A2, Fz, Cz, Pz). The following electrodes were missing or displaced: none. Background: The background consists predominantly of generalized irregular slow activities with occasional periods where a posterior dominant rhythm that is poorly formed of 7 to 8 Hz is seen.  Sleep spindles are seen at times and are symmetric. Photic stimulation: Physiologic driving is not performed EEG Abnormalities: 1) generalized irregular  slow activity 2) slow posterior dominant rhythm Clinical Interpretation: This EEG is consistent with a generalized nonspecific cerebral dysfunction (encephalopathy). There was no seizure or seizure predisposition recorded on this study. Please note that lack of epileptiform activity on EEG does not preclude the possibility of epilepsy. Ritta Slot, MD [facility] [phone] If 7pm- 7am, please page neurology on call as listed in AMION.  CT Head Wo Contrast Result Date: 12/09/2023 CLINICAL DATA:  Mental status  change EXAM: CT HEAD WITHOUT CONTRAST TECHNIQUE: Contiguous axial images were obtained from the base of the skull through the vertex without intravenous contrast. RADIATION DOSE REDUCTION: This exam was performed according to the departmental dose-optimization program which includes automated exposure control, adjustment of the mA and/or kV according to patient size and/or use of iterative reconstruction technique. COMPARISON:  MRI brain 12/28/2019 FINDINGS: Brain: No evidence of acute infarction, hemorrhage, hydrocephalus, extra-axial collection or mass lesion/mass effect. Again seen is mild diffuse atrophy and mild periventricular white matter hypodensity, likely chronic small vessel ischemic change. Vascular: No hyperdense vessel or unexpected calcification. Skull: Normal. Negative for fracture or focal lesion. Sinuses/Orbits: No acute finding. Other: None. IMPRESSION: 1. No acute intracranial process. 2. Mild diffuse atrophy and mild chronic small vessel ischemic change. Electronically Signed   By: Darliss Cheney M.D.   On: 12/09/2023 04:01   DG Chest Port 1 View Result Date: 12/09/2023 CLINICAL DATA:  Seizure EXAM: PORTABLE CHEST 1 VIEW COMPARISON:  Chest x-ray 11/27/2008 FINDINGS: The cardiomediastinal silhouette appears enlarged, a new finding. There central pulmonary vascular congestion. Lung volumes are low. There is no focal lung infiltrate, pleural effusion or pneumothorax.  No acute fractures are seen. IMPRESSION: 1. New enlarged cardiomediastinal silhouette. Findings may be related to patient positioning. Consider follow-up PA and lateral chest x-ray when patient's condition permits. 2. Central pulmonary vascular congestion. Electronically Signed   By: Darliss Cheney M.D.   On: 12/09/2023 03:56    Microbiology: Recent Results (from the past 240 hours)  Resp panel by RT-PCR (RSV, Flu A&B, Covid) Anterior Nasal Swab     Status: None   Collection Time: 12/09/23  3:04 AM   Specimen: Anterior Nasal Swab  Result Value Ref Range Status   SARS Coronavirus 2 by RT PCR NEGATIVE NEGATIVE Final    Comment: (NOTE) SARS-CoV-2 target nucleic acids are NOT DETECTED.  The SARS-CoV-2 RNA is generally detectable in upper respiratory specimens during the acute phase of infection. The lowest concentration of SARS-CoV-2 viral copies this assay can detect is 138 copies/mL. A negative result does not preclude SARS-Cov-2 infection and should not be used as the sole basis for treatment or other patient management decisions. A negative result may occur with  improper specimen collection/handling, submission of specimen other than nasopharyngeal swab, presence of viral mutation(s) within the areas targeted by this assay, and inadequate number of viral copies(<138 copies/mL). A negative result must be combined with clinical observations, patient history, and epidemiological information. The expected result is Negative.  Fact Sheet for Patients:  BloggerCourse.com  Fact Sheet for Healthcare Providers:  SeriousBroker.it  This test is no t yet approved or cleared by the [place] FDA and  has been authorized for detection and/or diagnosis of SARS-CoV-2 by FDA under an Emergency Use Authorization (EUA). This EUA will remain  in effect (meaning this test can be used) for the duration of the COVID-19 declaration under Section  564(b)(1) of the Act, 21 U.S.C.section 360bbb-3(b)(1), unless the authorization is terminated  or revoked sooner.       Influenza A by PCR NEGATIVE NEGATIVE Final   Influenza B by PCR NEGATIVE NEGATIVE Final    Comment: (NOTE) The Xpert Xpress SARS-CoV-2/FLU/RSV plus assay is intended as an aid in the diagnosis of influenza from Nasopharyngeal swab specimens and should not be used as a sole basis for treatment. Nasal washings and aspirates are unacceptable for Xpert Xpress SARS-CoV-2/FLU/RSV testing.  Fact Sheet for Patients: BloggerCourse.com  Fact Sheet for Healthcare Providers: SeriousBroker.it  This test is not yet approved or cleared by the [place] and has been authorized for detection and/or diagnosis of SARS-CoV-2 by FDA under an Emergency Use Authorization (EUA). This EUA will remain in effect (meaning this test can be used) for the duration of the COVID-19 declaration under Section 564(b)(1) of the Act, 21 U.S.C. section 360bbb-3(b)(1), unless the authorization is terminated or revoked.     Resp Syncytial Virus by PCR NEGATIVE NEGATIVE Final    Comment: (NOTE) Fact Sheet for Patients: BloggerCourse.com  Fact Sheet for Healthcare Providers: SeriousBroker.it  This test is not yet approved or cleared by the [place] FDA and has been authorized for detection and/or diagnosis of SARS-CoV-2 by FDA under an Emergency Use Authorization (EUA). This EUA will remain in effect (meaning this test can be used) for the duration of the COVID-19 declaration under Section 564(b)(1) of the Act, 21 U.S.C. section 360bbb-3(b)(1), unless the authorization is terminated or revoked.  Performed at [facility], [address] [place], [place] [address]      Labs: CBC: Recent Labs  Lab 12/09/23 0304 12/10/23 0606 12/11/23 0549  WBC 9.5 12.3* 10.0  HGB  15.7 14.3 13.9  HCT 45.3 43.0 41.8  MCV 82.2 84.5 84.8  PLT 165 171 163   Basic Metabolic Panel: Recent Labs  Lab 12/09/23 0304 12/09/23 1205 12/10/23 0606 12/11/23 0549  NA 140  --  141 143  K 3.3*  --  3.5 3.7  CL 101  --  107 107  CO2 24  --  26 25  GLUCOSE 146*  --  85 86  BUN 11  --  10 16  CREATININE 0.97  --  0.80 0.64  CALCIUM 8.6*  --  7.9* 7.8*  MG  --  2.1  --  2.1  PHOS  --   --  2.5 2.1*   Liver Function Tests: Recent Labs  Lab 12/09/23 0304  AST 29  ALT 21  ALKPHOS 55  BILITOT 0.7  PROT 6.9  ALBUMIN 3.7   No results for input(s): "LIPASE", "AMYLASE" in the last 168 hours. No results for input(s): "AMMONIA" in the last 168 hours. Cardiac Enzymes: Recent Labs  Lab 12/09/23 0451 12/10/23 0606 12/11/23 0549  CKTOTAL 402* 1,793* 751*   BNP (last 3 results) No results for input(s): "BNP" in the last 8760 hours. CBG: No results for input(s): "GLUCAP" in the last 168 hours.  Time spent: 35 minutes  Signed:  Gillis Santa  [facility] 12/11/2023 12:13 PM

## 2023-12-11 NOTE — ED Notes (Signed)
Pt soiled brief and linen w/ BM. Pt cleaned, new brief applied and linen changed. Wife at bedside.

## 2023-12-11 NOTE — ED Notes (Signed)
This RN and another RN provided pericare and applied new brief to pt. Pt was repositioned and had pillow placed under legs. Pt tolerated well. NAD. Family at bedside.

## 2023-12-11 NOTE — Progress Notes (Signed)
OT Cancellation Note  Patient Details Name: Bryan [name] MRN: [id number] DOB: [birth year]   Cancelled Treatment:    Reason Eval/Treat Not Completed: Other (comment). Chart reviewed, discussed with PT. Pt's spouse planning on bringing patient home ASAP and is planning on pt being bed level. Requests to hold therapy evals at this time. OT to complete orders, please re-consult if new needs arise.   Sabella Traore L. Avyay Coger, OTR/L 12/11/23, 1:17 PM

## 2023-12-11 NOTE — ED Notes (Signed)
Patient noted to be wet. Patient cleaned and new brief placed at this time.

## 2023-12-11 NOTE — ED Notes (Addendum)
Patient noted to be wet. Patient cleaned and placed in new brief. Patient awaiting EMS transport home. Discharge papers given the patient's wife.

## 2023-12-11 NOTE — ED Notes (Signed)
 [facility]  CALLED  FOR  TRANSPORT  HOME

## 2023-12-11 NOTE — Evaluation (Signed)
Clinical/Bedside Swallow Evaluation Patient Details  Name: Bryan [name] MRN: [id number] Date of Birth: 11-24-1960  Today's Date: 12/11/2023 Time: SLP Start Time (ACUTE ONLY): 1115 SLP Stop Time (ACUTE ONLY): 1220 SLP Time Calculation (min) (ACUTE ONLY): 65 min  Past Medical History:  Past Medical History:  Diagnosis Date   Hypertension    Past Surgical History:  Past Surgical History:  Procedure Laterality Date   BACK SURGERY     BACK SURGERY  ankle acl right   HERNIA REPAIR     HPI:  Pt is a 63 y.o. male with medical history significant of Obesity, hypertension, dementia presenting with new onset seizure, agitation, NSTEMI, prolonged QT.  Limited history in the setting of recent seizure.  Per the wife, patient with noted episode of generalized tonic-clonic event around 3:00 this morning after using the bathroom.  Wife denies any known prior episodes like this in the past.  Wife does report patient with progressive dementia for the past 4 to 5 years.  Followed by the [facility].  In review of the chart, looks to have been seen by [facility] neurology around April 2021.  Had a EEG that was not indicative of seizure at that time.  No reported recent medication changes.  No nausea or vomiting.  Baseline hypertension.  On multiple medications.  EMS subsequently called with patient receiving 10 mg of Versed as well as 5 mg of Haldol.  Patient still combative despite medication.  Presented to the ER afebrile, hemodynamically stable.  On 2 to 4 L to keep O2 sats greater than 97%.  White count 9.5.  Per chart note, "Daughter stating that they no longer want MRI and would like to go home once resources are available at home. Pt's wife at home trying to contact [place] for resources.".   CXR: Central pulmonary vascular congestion.  Head CT: No acute intracranial process.  2. Mild diffuse atrophy and mild chronic small vessel ischemic  change.    Assessment / Plan / Recommendation  Clinical Impression   Pt seen for BSE  today. Pt awakened w/ verbal/tactile stim(MOD); Wife present to support. Pt has a Baseline of Dementia, Seizure activity. Received Haldol earlier this morning for agitation per Wife, NSG. MAX support required for sitting upright for oral intake. Pt drowsy but responsive to stim. Afebrile. WBC wnl. [place] O2 2L.   Pt appears to present w/ oropharyngeal phase dysphagia in setting of declined Cognitive status; Baseline Dementia. Family has reported that pt's Dementia may "worsening"; "the MD told us that it would get worse". Pt lives at home and is cared for by [employer] and the [place] services. Pt has also had recent Seizure activity leading to this hospitalization. Discussed w/ Wife and MD that ANY Cognitive decline can impact overall awareness/timing of swallow and safety during po tasks which increases risk for aspiration, choking. Wife endorsed that "pt coughs at home sometimes".  Pt's risk for aspiration/aspiration pneumonia  is present w/ current medical presentation can be reduced when following general aspiration precautions and using a modified diet consistency of Pureed, Nectar liquids(dysphagia diet) w/ MOD+ verbal/visual cues for follow through during po tasks and self-feeding.        Pt was only assessed w/ trials of Nectar liquids, ice chips, and purees d/t presenting Cognitive status. Pt consumed trials w/ No overt clinical s/s of aspiration noted: no decline in vocal quality, no cough, and no decline in respiratory status during/post trials. O2 sats remained 99% when checked. Oral phase was functional  and adequate for bolus management and oral clearing of the boluses given. He often impulsively opened mouth seeking/searching for another bite after quickly swallowing the first. Pinched straw was used w/ Nectar liquids -- education given to the Wife on this strategy to limit bolus size.  OM Exam was cursory d/t his Cognition but appeared [facility] for bolus management/clearing w/ No unilateral weakness noted.  Confusion of OM tasks and oral care noted. Pt only opened eyes briefly during session.         In setting of baseline Dementia and Cognitive decline w/ Acute illness and hospitalization, recommend initiation of the dysphagia level 1(pureed) foods moistened for ease of oral phase w/ Nectar liquids; aspiration precautions; reduce Distractions during meals and engage pt during meals for self-feeding as able. Check for oral clearing b/t bites -- eat/drink slowly. Pills Crushed in Puree for safer swallowing as needed. Support w/ feeding at meals. MD/NSG updated.  [facility] services recommends follow for toleration of diet and trials to upgrade when appropriate -- [facility] to seek [facility] in [facility](already has OT). Recommend f/u w/ Palliative Care for GOC and education re: impact of Cognitive decline/Dementia on swallowing. Dietician f/u.  Education reviewed w/ Wife; answered questions re: liquid/food prep. Supplies and handouts given. Aspiration Precautions given and posted in room, chart. Suggested a Dysphagia drink cup also. Wife agreed w/ above POC.  PER WIFE and MD: pt is discharging home today.  SLP Visit Diagnosis: Dysphagia, oropharyngeal phase (R13.12) (in setting of Cognitive decline; acute illness/seizure/Dementia)    Aspiration Risk  Mild aspiration risk;Moderate aspiration risk;Risk for inadequate nutrition/hydration    Diet Recommendation   Nectar;Dysphagia 1 (puree) = dysphagia level 1(pureed) foods moistened for ease of oral phase w/ Nectar liquids; aspiration precautions; reduce Distractions during meals and engage pt during meals for self-feeding as able. Check for oral clearing b/t bites -- eat/drink slowly. Support w/ feeding at meals.  Medication Administration: Crushed with puree    Other  Recommendations Recommended Consults:  (Dietician f/u; Palliative Care f/u) Oral Care Recommendations: Oral care BID;Oral care before and after PO;Staff/trained caregiver to provide oral care Caregiver  Recommendations: Avoid jello, ice cream, thin soups, popsicles;Remove water pitcher;Have oral suction available    Recommendations for follow up therapy are one component of a multi-disciplinary discharge planning process, led by the attending physician.  Recommendations may be updated based on patient status, additional functional criteria and insurance authorization.  Follow up Recommendations Home health SLP      Assistance Recommended at Discharge  FULL  Functional Status Assessment Patient has had a recent decline in their functional status and/or demonstrates limited ability to make significant improvements in function in a reasonable and predictable amount of time  Frequency and Duration  (tbd)   (tbd)       Prognosis Prognosis for improved oropharyngeal function: Fair Barriers to Reach Goals: Cognitive deficits;Language deficits;Time post onset;Severity of deficits;Behavior;Medication Barriers/Prognosis Comment: Cognitive decline; acute illness/seizure/Dementia      Swallow Study   General Date of Onset: 12/09/23 HPI: Pt is a 63 y.o. male with medical history significant of Obesity, hypertension, dementia presenting with new onset seizure, agitation, NSTEMI, prolonged QT.  Limited history in the setting of recent seizure.  Per the wife, patient with noted episode of generalized tonic-clonic event around 3:00 this morning after using the bathroom.  Wife denies any known prior episodes like this in the past.  Wife does report patient with progressive dementia for the past 4 to 5 years.  Followed by the [facility].  In review of the chart, looks to have been seen by [facility] neurology around April 2021.  Had a EEG that was not indicative of seizure at that time.  No reported recent medication changes.  No nausea or vomiting.  Baseline hypertension.  On multiple medications.  EMS subsequently called with patient receiving 10 mg of Versed as well as 5 mg of Haldol.  Patient still combative despite  medication.  Presented to the ER afebrile, hemodynamically stable.  On 2 to 4 L to keep O2 sats greater than 97%.  White count 9.5.  Per chart note, "Daughter stating that they no longer want MRI and would like to go home once resources are available at home. Pt's wife at home trying to contact [place] for resources.".   CXR: Central pulmonary vascular congestion.  Head CT: No acute intracranial process.  2. Mild diffuse atrophy and mild chronic small vessel ischemic  change. Type of Study: Bedside Swallow Evaluation Previous Swallow Assessment: none Diet Prior to this Study: NPO (soft foods at home) Temperature Spikes Noted: No (wbc 10.0) Respiratory Status: Nasal cannula (2L) History of Recent Intubation: No Behavior/Cognition: Cooperative;Pleasant mood;Confused;Impulsive;Lethargic/Drowsy;Distractible;Requires cueing;Doesn't follow directions (some of it baseline per Wife present) Oral Cavity Assessment: Dry Oral Care Completed by SLP: Yes Oral Cavity - Dentition: Adequate natural dentition Vision:  (n/a) Self-Feeding Abilities: Total assist Patient Positioning: Upright in bed (MAX assist) Baseline Vocal Quality: Low vocal intensity (mumbled speech) Volitional Cough: Cognitively unable to elicit Volitional Swallow: Unable to elicit    Oral/Motor/Sensory Function Overall Oral Motor/Sensory Function:  (appeared [facility] for bolus management)   Ice Chips Ice chips: Within functional limits Presentation: Spoon (fed; 3 trials) Other Comments: functional w/ min decreased awareness   Thin Liquid Thin Liquid: Not tested    Nectar Thick Nectar Thick Liquid: Within functional limits Presentation: Spoon;Straw (pinched to limit bolus size: ~4 ozs. Fed.)   Honey Thick Honey Thick Liquid: Not tested   Puree Puree: Within functional limits Presentation: Spoon (~4 ozs. Fed.)   Solid     Solid: Not tested        Jerilynn Som, MS, CCC-SLP Speech Language Pathologist Rehab Services; [facility] [phone] (ascom) Macen Joslin 12/11/2023,1:07 PM

## 2024-01-20 ENCOUNTER — Other Ambulatory Visit: Payer: Self-pay

## 2024-01-21 ENCOUNTER — Other Ambulatory Visit: Payer: Self-pay

## 2024-03-01 ENCOUNTER — Other Ambulatory Visit: Payer: Self-pay

## 2024-04-22 ENCOUNTER — Other Ambulatory Visit: Payer: Self-pay

## 2024-04-27 ENCOUNTER — Other Ambulatory Visit: Payer: Self-pay

## 2024-05-23 ENCOUNTER — Other Ambulatory Visit: Payer: Self-pay

## 2024-06-10 ENCOUNTER — Other Ambulatory Visit (HOSPITAL_COMMUNITY): Payer: Self-pay

## 2024-07-18 ENCOUNTER — Other Ambulatory Visit: Payer: Self-pay
